# Patient Record
Sex: Male | Born: 1965 | Race: White | Hispanic: No | Marital: Married | Smoking: Former smoker
Health system: Southern US, Community
[De-identification: ages and names within clinical notes are randomized; demographics above are authoritative.]

## PROBLEM LIST (undated history)

## (undated) ENCOUNTER — Emergency Department (HOSPITAL_COMMUNITY): Payer: Self-pay

## (undated) DIAGNOSIS — N200 Calculus of kidney: Secondary | ICD-10-CM

---

## 2001-11-19 ENCOUNTER — Emergency Department (HOSPITAL_COMMUNITY): Admission: EM | Admit: 2001-11-19 | Discharge: 2001-11-19 | Payer: Self-pay | Admitting: Emergency Medicine

## 2007-09-19 ENCOUNTER — Emergency Department (HOSPITAL_COMMUNITY): Admission: EM | Admit: 2007-09-19 | Discharge: 2007-09-19 | Payer: Self-pay | Admitting: Emergency Medicine

## 2011-01-12 LAB — URINALYSIS, ROUTINE W REFLEX MICROSCOPIC
Bilirubin Urine: NEGATIVE
Glucose, UA: NEGATIVE
Specific Gravity, Urine: 1.024
pH: 5

## 2011-01-12 LAB — POCT I-STAT, CHEM 8
BUN: 14
Chloride: 106
Creatinine, Ser: 0.9
Glucose, Bld: 154 — ABNORMAL HIGH
Hemoglobin: 15
Potassium: 3.2 — ABNORMAL LOW

## 2011-01-12 LAB — DIFFERENTIAL
Eosinophils Absolute: 0.3
Lymphocytes Relative: 29
Lymphs Abs: 3
Monocytes Relative: 5
Neutrophils Relative %: 63

## 2011-01-12 LAB — CBC
MCV: 88.6
RBC: 4.83
WBC: 11.5 — ABNORMAL HIGH

## 2011-01-12 LAB — URINE MICROSCOPIC-ADD ON

## 2011-12-25 ENCOUNTER — Encounter (HOSPITAL_COMMUNITY): Payer: Self-pay | Admitting: *Deleted

## 2011-12-25 ENCOUNTER — Emergency Department (INDEPENDENT_AMBULATORY_CARE_PROVIDER_SITE_OTHER)
Admission: EM | Admit: 2011-12-25 | Discharge: 2011-12-25 | Disposition: A | Discharge status: Home / Self Care | Payer: Self-pay | Source: Home / Self Care | Attending: Family Medicine | Admitting: Family Medicine

## 2011-12-25 DIAGNOSIS — N201 Calculus of ureter: Secondary | ICD-10-CM

## 2011-12-25 HISTORY — DX: Calculus of kidney: N20.0

## 2011-12-25 LAB — POCT URINALYSIS DIP (DEVICE)
Glucose, UA: NEGATIVE mg/dL
Leukocytes, UA: NEGATIVE
Nitrite: NEGATIVE
Urobilinogen, UA: 0.2 mg/dL (ref 0.0–1.0)

## 2011-12-25 MED ORDER — TAMSULOSIN HCL 0.4 MG PO CAPS: 0.4000 mg | ORAL_CAPSULE | Freq: Every day | ORAL | Status: AC

## 2011-12-25 MED ORDER — ONDANSETRON HCL 4 MG/2ML IJ SOLN
INTRAMUSCULAR | Status: AC
  Filled 2011-12-25: qty 2

## 2011-12-25 MED ORDER — HYDROMORPHONE HCL PF 1 MG/ML IJ SOLN
INTRAMUSCULAR | Status: AC
  Filled 2011-12-25: qty 2

## 2011-12-25 MED ORDER — ONDANSETRON HCL 4 MG/2ML IJ SOLN: 4.0000 mg | Freq: Once | INTRAMUSCULAR | Status: DC

## 2011-12-25 MED ORDER — OXYCODONE-ACETAMINOPHEN 5-325 MG PO TABS: 1.0000 | ORAL_TABLET | ORAL | Status: AC | PRN

## 2011-12-25 MED ORDER — SODIUM CHLORIDE 0.9 % IV SOLN
Freq: Once | INTRAVENOUS | Status: AC
  Administered 2011-12-25: 15:00:00 via INTRAVENOUS

## 2011-12-25 MED ORDER — KETOROLAC TROMETHAMINE 30 MG/ML IJ SOLN: 30.0000 mg | Freq: Once | INTRAMUSCULAR | Status: DC

## 2011-12-25 MED ORDER — KETOROLAC TROMETHAMINE 10 MG PO TABS: 10.0000 mg | ORAL_TABLET | Freq: Four times a day (QID) | ORAL | Status: AC | PRN

## 2011-12-25 MED ORDER — HYDROMORPHONE HCL PF 1 MG/ML IJ SOLN
2.0000 mg | Freq: Once | INTRAMUSCULAR | Status: AC
  Administered 2011-12-25: 2 mg via INTRAVENOUS

## 2011-12-25 MED ORDER — KETOROLAC TROMETHAMINE 30 MG/ML IJ SOLN
30.0000 mg | Freq: Once | INTRAMUSCULAR | Status: AC
  Administered 2011-12-25: 30 mg via INTRAVENOUS

## 2011-12-25 MED ORDER — KETOROLAC TROMETHAMINE 30 MG/ML IJ SOLN
INTRAMUSCULAR | Status: AC
  Filled 2011-12-25: qty 1

## 2011-12-25 NOTE — ED Notes (Signed)
Pt  Reports  r        r  Flank  Pain       With  Nausea  /  Vomiting  Symptoms   Began  Several  Hours  Ago  With  Sensation of  Some  Burning as   Well          Has  Had  Kidney     Stone            In past

## 2011-12-25 NOTE — ED Notes (Signed)
Iv  Ns  500  Ml  Bolus      Infused  Via  20  Angio  And    Was  D/c        Pt  Was  Also  Given a  Strainer to take  home

## 2011-12-25 NOTE — ED Provider Notes (Signed)
History     CSN: [id number]  Arrival date & time 12/25/11  1403   First MD Initiated Contact with Patient 12/25/11 1416      Chief Complaint  Patient presents with  . Flank Pain    (Consider location/radiation/quality/duration/timing/severity/associated sxs/prior treatment) Patient is a 46 y.o. male presenting with flank pain. The history is provided by the patient and the spouse.  Flank Pain This is a new (h/o left kidney stone approx 1 yr ago.) problem. The current episode started 1 to 2 hours ago. The problem occurs constantly. The problem has been gradually worsening. Associated symptoms include abdominal pain. Associated symptoms comments: Right flank pain into right scrotum, diaphoresis, nausea..    History reviewed. No pertinent past medical history.  No past surgical history on file.  No family history on file.  History  Substance Use Topics  . Smoking status: Not on file  . Smokeless tobacco: Not on file  . Alcohol Use: Not on file      Review of Systems  Constitutional: Negative.   Respiratory: Negative.   Cardiovascular: Negative.   Gastrointestinal: Positive for nausea and abdominal pain.  Genitourinary: Positive for flank pain and testicular pain.  Neurological: Negative.     Allergies  Review of patient's allergies indicates no known allergies.  Home Medications  No current outpatient prescriptions on file.  BP 153/98  Pulse 100  Temp 98.3 F (36.8 C) (Oral)  Resp 24  SpO2 100%  Physical Exam  Nursing note and vitals reviewed. Constitutional: He is oriented to person, place, and time. He appears well-developed and well-nourished.  Abdominal: Soft. Bowel sounds are normal. He exhibits no distension and no mass. There is tenderness in the right lower quadrant and suprapubic area. There is CVA tenderness. There is no rebound and no guarding. Hernia confirmed negative in the right inguinal area and confirmed negative in the left inguinal area.    Neurological: He is alert and oriented to person, place, and time.  Skin: Skin is warm and dry.    ED Course  Procedures (including critical care time)  Labs Reviewed - No data to display No results found.   No diagnosis found.    MDM  Pain free at d/c. U/a hematuria      Linna Hoff, MD 12/29/11 1332

## 2011-12-25 NOTE — ED Notes (Signed)
Pt  Reports    Pain is  Much  Better  At this  Time  Nausea  Has  Subsided  As  well

## 2018-01-14 ENCOUNTER — Ambulatory Visit (INDEPENDENT_AMBULATORY_CARE_PROVIDER_SITE_OTHER): Payer: Self-pay

## 2018-01-14 ENCOUNTER — Ambulatory Visit (INDEPENDENT_AMBULATORY_CARE_PROVIDER_SITE_OTHER): Payer: Self-pay | Admitting: Family Medicine

## 2018-01-14 ENCOUNTER — Encounter (INDEPENDENT_AMBULATORY_CARE_PROVIDER_SITE_OTHER): Payer: Self-pay | Admitting: Family Medicine

## 2018-01-14 DIAGNOSIS — M25562 Pain in left knee: Secondary | ICD-10-CM

## 2018-01-14 MED ORDER — HYDROCODONE-ACETAMINOPHEN 5-325 MG PO TABS: 1.0000 | ORAL_TABLET | Freq: Four times a day (QID) | ORAL | 0 refills | Status: DC | PRN

## 2018-01-14 MED ORDER — ETODOLAC 400 MG PO TABS: 400.0000 mg | ORAL_TABLET | Freq: Two times a day (BID) | ORAL | 3 refills | Status: AC | PRN

## 2018-01-14 MED ORDER — METHYLPREDNISOLONE ACETATE 40 MG/ML IJ SUSP: 40.0000 mg | Freq: Once | INTRAMUSCULAR | Status: AC

## 2018-01-14 NOTE — Progress Notes (Signed)
   Office Visit Note   Patient: Dennis Wong           Date of Birth: 1965/12/11           MRN: [id number] Visit Date: 01/14/2018 Requested by: No referring provider defined for this encounter. PCP: Patient, No Pcp Per  Subjective: Chief Complaint  Patient presents with  . Left Knee - Pain    Left knee pain for about a couple weeks A little swelling in left knee Hurts to bend, walk, and apply pressure No fall/injury Naprosyn    HPI: He is a 52 year old with left knee pain.  Symptoms started 2 or 3 weeks ago, no injury.  Stiffness, swelling, pain with flexion.  Using naproxen with no improvement.  No history of gout.  No previous problems with his knee.  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              ROS: Otherwise noncontributory  Objective: Vital Signs: There were no vitals taken for this visit.  Physical Exam:  Left knee: 2+ effusion, no warmth or erythema.  No patellofemoral crepitus.  Full extension, flexion of 110 degrees.  Tender on the posterior medial joint line.  Tenderness in the popliteal fossa but no definite cyst.  Imaging: 2 view left knee x-rays: Well-preserved joint space, no significant arthritis, no sign of loose body.  Assessment & Plan: 1.  Left knee pain with effusion, suspicious for degenerative meniscus tear -Discussed options with patient, elected to aspirate and inject the knee today.  Follow-up as needed.  MRI if symptoms worsen.   Follow-Up Instructions: No follow-ups on file.       Procedures: Left knee aspiration and injection: After sterile prep with Betadine, injected 5 cc 1% lidocaine without epinephrine then aspirated 30 cc of clear yellow synovial fluid, then injected 40 mg methylprednisolone from superolateral approach.   PMFS History: There are no active problems to display for this patient.  Past Medical History:  Diagnosis Date  . Kidney stone     History reviewed. No pertinent family history.  History reviewed. No  pertinent surgical history. Social History   Occupational History  . Not on file  Tobacco Use  . Smoking status: Current Every Day Smoker  . Smokeless tobacco: Never Used  Substance and Sexual Activity  . Alcohol use: No  . Drug use: Not on file  . Sexual activity: Not on file

## 2018-01-29 ENCOUNTER — Other Ambulatory Visit (INDEPENDENT_AMBULATORY_CARE_PROVIDER_SITE_OTHER): Payer: Self-pay | Admitting: Family Medicine

## 2018-01-29 DIAGNOSIS — M25562 Pain in left knee: Secondary | ICD-10-CM

## 2018-01-29 MED ORDER — NABUMETONE 500 MG PO TABS: 500.0000 mg | ORAL_TABLET | Freq: Two times a day (BID) | ORAL | 3 refills | Status: AC | PRN

## 2018-01-29 NOTE — Progress Notes (Signed)
[facility] has the order, they will call pt to schedule.

## 2018-01-30 NOTE — Progress Notes (Signed)
Thank you :)

## 2018-02-10 ENCOUNTER — Ambulatory Visit
Admission: RE | Admit: 2018-02-10 | Discharge: 2018-02-10 | Disposition: A | Discharge status: Home / Self Care | Payer: Self-pay | Source: Ambulatory Visit | Attending: Family Medicine | Admitting: Family Medicine

## 2018-02-10 DIAGNOSIS — M25562 Pain in left knee: Secondary | ICD-10-CM

## 2018-02-11 ENCOUNTER — Telehealth (INDEPENDENT_AMBULATORY_CARE_PROVIDER_SITE_OTHER): Payer: Self-pay | Admitting: Family Medicine

## 2018-02-11 NOTE — Telephone Encounter (Signed)
Knee MRI shows a large tear of the medial meniscus cartilage.  This would explain why it keeps bothering him.  Presuming he's still in pain, I recommend coming in to see SD, MX, or CB for possible arthroscopic surgery.

## 2018-02-12 ENCOUNTER — Ambulatory Visit (INDEPENDENT_AMBULATORY_CARE_PROVIDER_SITE_OTHER): Payer: Self-pay | Admitting: Surgery

## 2018-02-12 ENCOUNTER — Telehealth (INDEPENDENT_AMBULATORY_CARE_PROVIDER_SITE_OTHER): Payer: Self-pay

## 2018-02-12 NOTE — Telephone Encounter (Signed)
The patient was advised of the results by another assistant today (see other message on this).  He has an appointment scheduled with Dr. August Saucer on 02/15/18.

## 2018-02-12 NOTE — Telephone Encounter (Signed)
Patient called concerning MRI results.  Advised patient of message per Dr. Prince Rome.  Patient stated that he will call back to schedule an appointment with one of the other providers.

## 2018-02-13 ENCOUNTER — Ambulatory Visit (INDEPENDENT_AMBULATORY_CARE_PROVIDER_SITE_OTHER): Payer: Self-pay | Admitting: Orthopedic Surgery

## 2018-02-15 ENCOUNTER — Telehealth (INDEPENDENT_AMBULATORY_CARE_PROVIDER_SITE_OTHER): Payer: Self-pay | Admitting: Orthopedic Surgery

## 2018-02-15 ENCOUNTER — Encounter (INDEPENDENT_AMBULATORY_CARE_PROVIDER_SITE_OTHER): Payer: Self-pay | Admitting: Orthopedic Surgery

## 2018-02-15 ENCOUNTER — Ambulatory Visit (INDEPENDENT_AMBULATORY_CARE_PROVIDER_SITE_OTHER): Payer: Self-pay | Admitting: Orthopedic Surgery

## 2018-02-15 DIAGNOSIS — M25562 Pain in left knee: Secondary | ICD-10-CM

## 2018-02-15 DIAGNOSIS — S838X2D Sprain of other specified parts of left knee, subsequent encounter: Secondary | ICD-10-CM

## 2018-02-15 MED ORDER — LIDOCAINE HCL 1 % IJ SOLN
5.0000 mL | INTRAMUSCULAR | Status: AC | PRN
  Administered 2018-02-15: 5 mL

## 2018-02-15 MED ORDER — HYDROCODONE-ACETAMINOPHEN 10-325 MG PO TABS: ORAL_TABLET | ORAL | 0 refills | Status: DC

## 2018-02-15 NOTE — Progress Notes (Signed)
Office Visit Note   Patient: Dennis Wong           Date of Birth: [birth year]           MRN: [id number] Visit Date: 02/15/2018 Requested by: No referring provider defined for this encounter. PCP: Patient, No Pcp Per  Subjective: Chief Complaint  Patient presents with  . Left Knee - Pain    HPI: Patient presents with left knee pain.  Since he was last seen by Dr. Prince Rome he had an MRI scan which is reviewed.  Patient has a large medial meniscal tear with substantial portion of the meniscus torn and flipped into the condylar notch region.  She also has stress reaction and possible stress fracture in that medial femoral condyle.  Patient localizes the pain discretely to the medial aspect of the knee.  Denies any history of injury.  He works in [employer] which does require a lot of standing.  Has had an aspiration and injection of cortisone which did not help.              ROS: All systems reviewed are negative as they relate to the chief complaint within the history of present illness.  Patient denies  fevers or chills.   Assessment & Plan: Visit Diagnoses:  1. Acute pain of left knee   2. Injury of meniscus of left knee, subsequent encounter     Plan: Impression is medial meniscal tear left knee with subsequent stress reaction and that medial femoral condyle.  Spontaneous osteonecrosis possible but less likely.  I like to check his vitamin D level today.  He states that essentially he never goes outside.  I think also that arthroscopy with meniscal debridement and intraosseous bio plasty is indicated at this time.  I also counseled him to diminish his weightbearing on that right leg which he will likely be reluctant to do.  I think surgical intervention is indicated with the.  Of nonweightbearing for at least 4 weeks afterwards in order to allow for healing.  No family or personal history of DVT or pulmonary embolism.  All questions answered.  Taken to consider their options and scheduled  for surgery potentially in the near future.  The aspiration of the bone marrow from the iliac crest is also described it would be an interval part of the procedure in order to facilitate healing of this stress reaction.  Follow-Up Instructions: No follow-ups on file.   Orders:  Orders Placed This Encounter  Procedures  . Vitamin D (25 hydroxy)   Meds ordered this encounter  Medications  . HYDROcodone-acetaminophen (NORCO) 10-325 MG tablet    Sig: 1 po q 8 hrs prn pain    Dispense:  30 tablet    Refill:  0      Procedures: Large Joint Inj: L knee on 02/15/2018 6:19 PM Indications: diagnostic evaluation, joint swelling and pain Details: 18 G 1.5 in needle, superolateral approach  Arthrogram: No  Medications: 5 mL lidocaine 1 % Aspirate: 40 mL yellow Outcome: tolerated well, no immediate complications Procedure, treatment alternatives, risks and benefits explained, specific risks discussed. Consent was given by the patient. Immediately prior to procedure a time out was called to verify the correct patient, procedure, equipment, support staff and site/side marked as required. Patient was prepped and draped in the usual sterile fashion.       Clinical Data: No additional findings.  Objective: Vital Signs: There were no vitals taken for this visit.  Physical Exam:  Constitutional: Patient appears well-developed HEENT:  Head: Normocephalic Eyes:EOM are normal Neck: Normal range of motion Cardiovascular: Normal rate Pulmonary/chest: Effort normal Neurologic: Patient is alert Skin: Skin is warm Psychiatric: Patient has normal mood and affect    Ortho Exam: Ortho exam demonstrates antalgic gait to the left with mild knee effusion to moderate knee effusion.  Medial sided femoral condyle tenderness is present.  Collateral cruciate ligaments are stable.  Range of motion is good.  Pedal pulses palpable.  No groin pain with internal extra rotation of the leg and only mild  varus deformity is present.  Specialty Comments:  No specialty comments available.  Imaging: No results found.   PMFS History: There are no active problems to display for this patient.  Past Medical History:  Diagnosis Date  . Kidney stone     History reviewed. No pertinent family history.  History reviewed. No pertinent surgical history. Social History   Occupational History  . Not on file  Tobacco Use  . Smoking status: Current Every Day Smoker  . Smokeless tobacco: Never Used  Substance and Sexual Activity  . Alcohol use: No  . Drug use: Not on file  . Sexual activity: Not on file

## 2018-02-15 NOTE — Telephone Encounter (Signed)
Patient's wife called stating that Dr. August Saucer prescribed for him was the exact same medication (hydrocodone) that did not work for him.  CB#[phone].  Thank you.

## 2018-02-15 NOTE — Telephone Encounter (Signed)
I tried calling her back. No answer. LMVM for her advising it is same medication but dose Dr August Saucer prescribed is stronger. I had also talked with them about this before they left office today.

## 2018-02-16 LAB — EXTRA LAV TOP TUBE

## 2018-02-16 LAB — VITAMIN D 25 HYDROXY (VIT D DEFICIENCY, FRACTURES): Vit D, 25-Hydroxy: 12 ng/mL — ABNORMAL LOW (ref 30–100)

## 2018-02-18 NOTE — Progress Notes (Signed)
Please call patient with results. Thanks patient has very low vitamin D.  His level is 12.  He needs oral supplementation 50,000 units a week for the next 8 weeks.  If he combines that with nonweightbearing I think his stress fracture will heal.  Please call in the prescription and call the patient thanks

## 2018-02-19 ENCOUNTER — Telehealth (INDEPENDENT_AMBULATORY_CARE_PROVIDER_SITE_OTHER): Payer: Self-pay

## 2018-02-19 ENCOUNTER — Other Ambulatory Visit (INDEPENDENT_AMBULATORY_CARE_PROVIDER_SITE_OTHER): Payer: Self-pay

## 2018-02-19 MED ORDER — VITAMIN D (ERGOCALCIFEROL) 1.25 MG (50000 UNIT) PO CAPS: 50000.0000 [IU] | ORAL_CAPSULE | ORAL | 0 refills | Status: AC

## 2018-02-19 NOTE — Telephone Encounter (Signed)
ca

## 2018-02-19 NOTE — Telephone Encounter (Signed)
-----   Message from Cammy Copa, MD sent at 02/18/2018  5:58 PM EST ----- Please call patient with results. Thanks patient has very low vitamin D.  His level is 12.  He needs oral supplementation 50,000 units a week for the next 8 weeks.  If he combines that with nonweightbearing I think his stress fracture will heal.  Please call in the prescription and call the patient thanks

## 2018-02-19 NOTE — Telephone Encounter (Signed)
Called pt to advise of lab results he voiced understanding. rx faxed into pharm and will call with any questions.

## 2018-04-17 ENCOUNTER — Other Ambulatory Visit (INDEPENDENT_AMBULATORY_CARE_PROVIDER_SITE_OTHER): Payer: Self-pay | Admitting: Orthopedic Surgery

## 2018-04-18 NOTE — Telephone Encounter (Signed)
He needs to get his vitamin D level rechecked by his primary care physician before we have him taking any more.

## 2018-04-18 NOTE — Telephone Encounter (Signed)
Refill

## 2019-07-26 ENCOUNTER — Ambulatory Visit: Payer: Self-pay | Attending: Internal Medicine

## 2019-07-26 DIAGNOSIS — Z23 Encounter for immunization: Secondary | ICD-10-CM

## 2019-07-26 NOTE — Progress Notes (Signed)
   Covid-19 Vaccination Clinic  Name:  Dennis Wong    MRN: [id number] DOB: 24-Jul-1965  07/26/2019  Mr. Mucha was observed post Covid-19 immunization for 15 minutes without incident. He was provided with Vaccine Information Sheet and instruction to access the V-Safe system.   Mr. Begley was instructed to call 911 with any severe reactions post vaccine: Marland Kitchen Difficulty breathing  . Swelling of face and throat  . A fast heartbeat  . A bad rash all over body  . Dizziness and weakness   Immunizations Administered    Name Date Dose VIS Date Route   Pfizer COVID-19 Vaccine 07/26/2019  8:23 AM 0.3 mL 03/28/2019 Intramuscular   Manufacturer: [employer], [employer]   Lot: [id number]   NDC: [address]-8

## 2019-08-19 ENCOUNTER — Ambulatory Visit: Payer: Self-pay | Attending: Internal Medicine

## 2019-08-19 DIAGNOSIS — Z23 Encounter for immunization: Secondary | ICD-10-CM

## 2019-08-19 NOTE — Progress Notes (Signed)
   Covid-19 Vaccination Clinic  Name:  Dennis Wong    MRN: [id number] DOB: 06-26-1965  08/19/2019  [name] was observed post Covid-19 immunization for 15 minutes without incident. He was provided with Vaccine Information Sheet and instruction to access the V-Safe system.   [name] was instructed to call 911 with any severe reactions post vaccine: Marland Kitchen Difficulty breathing  . Swelling of face and throat  . A fast heartbeat  . A bad rash all over body  . Dizziness and weakness   Immunizations Administered    Name Date Dose VIS Date Route   Pfizer COVID-19 Vaccine 08/19/2019  8:33 AM 0.3 mL 06/11/2018 Intramuscular   Manufacturer: [employer], [employer]   Lot: [id number]   NDC: [address]-0

## 2021-05-30 ENCOUNTER — Encounter (HOSPITAL_COMMUNITY): Payer: Self-pay | Admitting: *Deleted

## 2021-05-30 ENCOUNTER — Ambulatory Visit (HOSPITAL_COMMUNITY)
Admission: EM | Admit: 2021-05-30 | Discharge: 2021-05-30 | Disposition: A | Discharge status: Home / Self Care | Payer: Self-pay | Attending: Emergency Medicine | Admitting: Emergency Medicine

## 2021-05-30 ENCOUNTER — Emergency Department (HOSPITAL_COMMUNITY): Payer: Self-pay

## 2021-05-30 ENCOUNTER — Emergency Department (HOSPITAL_BASED_OUTPATIENT_CLINIC_OR_DEPARTMENT_OTHER): Payer: Self-pay | Admitting: Certified Registered"

## 2021-05-30 ENCOUNTER — Encounter (HOSPITAL_COMMUNITY)
Admission: EM | Disposition: A | Discharge status: Home / Self Care | Payer: Self-pay | Source: Home / Self Care | Attending: Emergency Medicine

## 2021-05-30 ENCOUNTER — Other Ambulatory Visit: Payer: Self-pay

## 2021-05-30 ENCOUNTER — Emergency Department (HOSPITAL_COMMUNITY): Payer: Self-pay | Admitting: Certified Registered"

## 2021-05-30 DIAGNOSIS — R17 Unspecified jaundice: Secondary | ICD-10-CM

## 2021-05-30 DIAGNOSIS — R7309 Other abnormal glucose: Secondary | ICD-10-CM

## 2021-05-30 DIAGNOSIS — K8020 Calculus of gallbladder without cholecystitis without obstruction: Secondary | ICD-10-CM

## 2021-05-30 DIAGNOSIS — R1011 Right upper quadrant pain: Secondary | ICD-10-CM

## 2021-05-30 DIAGNOSIS — F17210 Nicotine dependence, cigarettes, uncomplicated: Secondary | ICD-10-CM | POA: Insufficient documentation

## 2021-05-30 DIAGNOSIS — K8010 Calculus of gallbladder with chronic cholecystitis without obstruction: Secondary | ICD-10-CM | POA: Insufficient documentation

## 2021-05-30 DIAGNOSIS — K8050 Calculus of bile duct without cholangitis or cholecystitis without obstruction: Secondary | ICD-10-CM

## 2021-05-30 DIAGNOSIS — Z419 Encounter for procedure for purposes other than remedying health state, unspecified: Secondary | ICD-10-CM

## 2021-05-30 DIAGNOSIS — R739 Hyperglycemia, unspecified: Secondary | ICD-10-CM

## 2021-05-30 DIAGNOSIS — K8000 Calculus of gallbladder with acute cholecystitis without obstruction: Secondary | ICD-10-CM

## 2021-05-30 DIAGNOSIS — K810 Acute cholecystitis: Secondary | ICD-10-CM

## 2021-05-30 DIAGNOSIS — Z20822 Contact with and (suspected) exposure to covid-19: Secondary | ICD-10-CM | POA: Insufficient documentation

## 2021-05-30 HISTORY — PX: CHOLECYSTECTOMY: SHX55

## 2021-05-30 HISTORY — PX: INTRAOPERATIVE CHOLANGIOGRAM: SHX5230

## 2021-05-30 LAB — CBC WITH DIFFERENTIAL/PLATELET
Abs Immature Granulocytes: 0.03 10*3/uL (ref 0.00–0.07)
Basophils Absolute: 0.1 10*3/uL (ref 0.0–0.1)
Basophils Relative: 1 %
Eosinophils Absolute: 0.1 10*3/uL (ref 0.0–0.5)
Eosinophils Relative: 1 %
HCT: 43.8 % (ref 39.0–52.0)
Hemoglobin: 15.1 g/dL (ref 13.0–17.0)
Immature Granulocytes: 0 %
Lymphocytes Relative: 18 %
Lymphs Abs: 1.2 10*3/uL (ref 0.7–4.0)
MCH: 28.8 pg (ref 26.0–34.0)
MCHC: 34.5 g/dL (ref 30.0–36.0)
MCV: 83.4 fL (ref 80.0–100.0)
Monocytes Absolute: 0.4 10*3/uL (ref 0.1–1.0)
Monocytes Relative: 6 %
Neutro Abs: 5.1 10*3/uL (ref 1.7–7.7)
Neutrophils Relative %: 74 %
Platelets: 195 10*3/uL (ref 150–400)
RBC: 5.25 MIL/uL (ref 4.22–5.81)
RDW: 14.1 % (ref 11.5–15.5)
WBC: 6.9 10*3/uL (ref 4.0–10.5)
nRBC: 0 % (ref 0.0–0.2)

## 2021-05-30 LAB — COMPREHENSIVE METABOLIC PANEL
ALT: 14 U/L (ref 0–44)
AST: 23 U/L (ref 15–41)
Albumin: 4.2 g/dL (ref 3.5–5.0)
Alkaline Phosphatase: 85 U/L (ref 38–126)
Anion gap: 9 (ref 5–15)
BUN: 12 mg/dL (ref 6–20)
CO2: 23 mmol/L (ref 22–32)
Calcium: 9.4 mg/dL (ref 8.9–10.3)
Chloride: 104 mmol/L (ref 98–111)
Creatinine, Ser: 0.81 mg/dL (ref 0.61–1.24)
GFR, Estimated: >60 mL/min (ref >60)
Glucose, Bld: 167 mg/dL — ABNORMAL HIGH (ref 70–99)
Potassium: 4.3 mmol/L (ref 3.5–5.1)
Sodium: 136 mmol/L (ref 135–145)
Total Bilirubin: 1.3 mg/dL — ABNORMAL HIGH (ref 0.3–1.2)
Total Protein: 6.5 g/dL (ref 6.5–8.1)

## 2021-05-30 LAB — RESP PANEL BY RT-PCR (FLU A&B, COVID) ARPGX2
Influenza A by PCR: NEGATIVE
Influenza B by PCR: NEGATIVE
SARS Coronavirus 2 by RT PCR: NEGATIVE

## 2021-05-30 LAB — LIPASE, BLOOD: Lipase: 34 U/L (ref 11–51)

## 2021-05-30 SURGERY — LAPAROSCOPIC CHOLECYSTECTOMY WITH INTRAOPERATIVE CHOLANGIOGRAM
Anesthesia: General | Site: Abdomen

## 2021-05-30 MED ORDER — MIDAZOLAM HCL 2 MG/2ML IJ SOLN
INTRAMUSCULAR | Status: AC
  Administered 2021-05-30: 2 mg via INTRAVENOUS
  Filled 2021-05-30: qty 2

## 2021-05-30 MED ORDER — LIDOCAINE 2% (20 MG/ML) 5 ML SYRINGE
INTRAMUSCULAR | Status: AC
  Filled 2021-05-30: qty 5

## 2021-05-30 MED ORDER — ACETAMINOPHEN 160 MG/5ML PO SOLN: 325.0000 mg | ORAL | Status: DC | PRN

## 2021-05-30 MED ORDER — ROCURONIUM BROMIDE 10 MG/ML (PF) SYRINGE
PREFILLED_SYRINGE | INTRAVENOUS | Status: DC | PRN
  Administered 2021-05-30: 80 mg via INTRAVENOUS
  Administered 2021-05-30: 20 mg via INTRAVENOUS

## 2021-05-30 MED ORDER — FENTANYL CITRATE (PF) 250 MCG/5ML IJ SOLN
INTRAMUSCULAR | Status: AC
  Filled 2021-05-30: qty 5

## 2021-05-30 MED ORDER — ONDANSETRON HCL 4 MG PO TABS: 4.0000 mg | ORAL_TABLET | Freq: Four times a day (QID) | ORAL | 0 refills | Status: DC | PRN

## 2021-05-30 MED ORDER — SUCCINYLCHOLINE CHLORIDE 200 MG/10ML IV SOSY
PREFILLED_SYRINGE | INTRAVENOUS | Status: AC
  Filled 2021-05-30: qty 10

## 2021-05-30 MED ORDER — PROPOFOL 10 MG/ML IV BOLUS
INTRAVENOUS | Status: AC
  Filled 2021-05-30: qty 20

## 2021-05-30 MED ORDER — MEPERIDINE HCL 25 MG/ML IJ SOLN: 6.2500 mg | INTRAMUSCULAR | Status: DC | PRN

## 2021-05-30 MED ORDER — CHLORHEXIDINE GLUCONATE 0.12 % MT SOLN
15.0000 mL | Freq: Once | OROMUCOSAL | Status: AC
  Administered 2021-05-30: 15 mL via OROMUCOSAL

## 2021-05-30 MED ORDER — SODIUM CHLORIDE 0.9 % IV SOLN
2.0000 g | Freq: Once | INTRAVENOUS | Status: AC
  Administered 2021-05-30: 2 g via INTRAVENOUS
  Filled 2021-05-30: qty 20

## 2021-05-30 MED ORDER — MIDAZOLAM HCL 2 MG/2ML IJ SOLN: 2.0000 mg | Freq: Once | INTRAMUSCULAR | Status: AC

## 2021-05-30 MED ORDER — LIDOCAINE 2% (20 MG/ML) 5 ML SYRINGE
INTRAMUSCULAR | Status: DC | PRN
  Administered 2021-05-30: 80 mg via INTRAVENOUS

## 2021-05-30 MED ORDER — ACETAMINOPHEN 10 MG/ML IV SOLN
INTRAVENOUS | Status: DC | PRN
  Administered 2021-05-30: 1000 mg via INTRAVENOUS

## 2021-05-30 MED ORDER — SODIUM CHLORIDE 0.9 % IV SOLN
INTRAVENOUS | Status: DC | PRN
  Administered 2021-05-30: 10 mL

## 2021-05-30 MED ORDER — DEXAMETHASONE SODIUM PHOSPHATE 10 MG/ML IJ SOLN
INTRAMUSCULAR | Status: DC | PRN
  Administered 2021-05-30: 10 mg via INTRAVENOUS

## 2021-05-30 MED ORDER — MORPHINE SULFATE (PF) 4 MG/ML IV SOLN
4.0000 mg | Freq: Once | INTRAVENOUS | Status: AC
  Administered 2021-05-30: 4 mg via INTRAVENOUS
  Filled 2021-05-30: qty 1

## 2021-05-30 MED ORDER — OXYCODONE HCL 5 MG PO TABS: 5.0000 mg | ORAL_TABLET | Freq: Once | ORAL | Status: DC | PRN

## 2021-05-30 MED ORDER — PHENYLEPHRINE HCL-NACL 20-0.9 MG/250ML-% IV SOLN
INTRAVENOUS | Status: DC | PRN
  Administered 2021-05-30: 50 ug/min via INTRAVENOUS

## 2021-05-30 MED ORDER — KETOROLAC TROMETHAMINE 30 MG/ML IJ SOLN
INTRAMUSCULAR | Status: DC | PRN
  Administered 2021-05-30: 30 mg via INTRAVENOUS

## 2021-05-30 MED ORDER — BUPIVACAINE-EPINEPHRINE 0.25% -1:200000 IJ SOLN
INTRAMUSCULAR | Status: DC | PRN
  Administered 2021-05-30: 14 mL

## 2021-05-30 MED ORDER — ONDANSETRON HCL 4 MG/2ML IJ SOLN
4.0000 mg | Freq: Once | INTRAMUSCULAR | Status: AC
  Administered 2021-05-30: 4 mg via INTRAVENOUS
  Filled 2021-05-30: qty 2

## 2021-05-30 MED ORDER — ONDANSETRON HCL 4 MG/2ML IJ SOLN: 4.0000 mg | Freq: Once | INTRAMUSCULAR | Status: DC | PRN

## 2021-05-30 MED ORDER — ACETAMINOPHEN 500 MG PO TABS
1000.0000 mg | ORAL_TABLET | Freq: Three times a day (TID) | ORAL | 0 refills | Status: AC | PRN
Start: 2021-05-30 — End: ?

## 2021-05-30 MED ORDER — FENTANYL CITRATE (PF) 250 MCG/5ML IJ SOLN
INTRAMUSCULAR | Status: DC | PRN
  Administered 2021-05-30 (×3): 50 ug via INTRAVENOUS
  Administered 2021-05-30: 100 ug via INTRAVENOUS

## 2021-05-30 MED ORDER — SUGAMMADEX SODIUM 200 MG/2ML IV SOLN
INTRAVENOUS | Status: DC | PRN
Start: 2021-05-30 — End: 2021-05-30
  Administered 2021-05-30: 400 mg via INTRAVENOUS

## 2021-05-30 MED ORDER — MIDAZOLAM HCL 2 MG/2ML IJ SOLN
INTRAMUSCULAR | Status: AC
  Filled 2021-05-30: qty 2

## 2021-05-30 MED ORDER — SODIUM CHLORIDE 0.9 % IV BOLUS (SEPSIS)
1000.0000 mL | Freq: Once | INTRAVENOUS | Status: AC
  Administered 2021-05-30: 1000 mL via INTRAVENOUS

## 2021-05-30 MED ORDER — ACETAMINOPHEN 325 MG PO TABS: 325.0000 mg | ORAL_TABLET | ORAL | Status: DC | PRN

## 2021-05-30 MED ORDER — OXYCODONE HCL 5 MG PO TABS: 5.0000 mg | ORAL_TABLET | Freq: Four times a day (QID) | ORAL | 0 refills | Status: DC | PRN

## 2021-05-30 MED ORDER — HYDROCODONE-ACETAMINOPHEN 5-325 MG PO TABS: 1.0000 | ORAL_TABLET | Freq: Four times a day (QID) | ORAL | 0 refills | Status: DC | PRN

## 2021-05-30 MED ORDER — 0.9 % SODIUM CHLORIDE (POUR BTL) OPTIME
TOPICAL | Status: DC | PRN
  Administered 2021-05-30: 1000 mL

## 2021-05-30 MED ORDER — OXYCODONE HCL 5 MG/5ML PO SOLN: 5.0000 mg | Freq: Once | ORAL | Status: DC | PRN

## 2021-05-30 MED ORDER — INDOCYANINE GREEN 25 MG IV SOLR
INTRAVENOUS | Status: DC | PRN
  Administered 2021-05-30: 7.5 mg via INTRAVENOUS

## 2021-05-30 MED ORDER — FENTANYL CITRATE (PF) 100 MCG/2ML IJ SOLN: 25.0000 ug | INTRAMUSCULAR | Status: DC | PRN

## 2021-05-30 MED ORDER — PHENYLEPHRINE 40 MCG/ML (10ML) SYRINGE FOR IV PUSH (FOR BLOOD PRESSURE SUPPORT)
PREFILLED_SYRINGE | INTRAVENOUS | Status: DC | PRN
  Administered 2021-05-30 (×2): 120 ug via INTRAVENOUS
  Administered 2021-05-30: 160 ug via INTRAVENOUS

## 2021-05-30 MED ORDER — ORAL CARE MOUTH RINSE: 15.0000 mL | Freq: Once | OROMUCOSAL | Status: AC

## 2021-05-30 MED ORDER — ROCURONIUM BROMIDE 10 MG/ML (PF) SYRINGE
PREFILLED_SYRINGE | INTRAVENOUS | Status: AC
  Filled 2021-05-30: qty 10

## 2021-05-30 MED ORDER — PROPOFOL 10 MG/ML IV BOLUS
INTRAVENOUS | Status: DC | PRN
  Administered 2021-05-30: 200 mg via INTRAVENOUS

## 2021-05-30 MED ORDER — SUCCINYLCHOLINE CHLORIDE 200 MG/10ML IV SOSY
PREFILLED_SYRINGE | INTRAVENOUS | Status: DC | PRN
  Administered 2021-05-30: 120 mg via INTRAVENOUS

## 2021-05-30 MED ORDER — SODIUM CHLORIDE 0.9 % IV SOLN
1000.0000 mL | INTRAVENOUS | Status: DC
  Administered 2021-05-30: 1000 mL via INTRAVENOUS

## 2021-05-30 MED ORDER — SODIUM CHLORIDE 0.9 % IR SOLN
Status: DC | PRN
Start: 2021-05-30 — End: 2021-05-30
  Administered 2021-05-30: 1000 mL

## 2021-05-30 MED ORDER — PHENYLEPHRINE 40 MCG/ML (10ML) SYRINGE FOR IV PUSH (FOR BLOOD PRESSURE SUPPORT)
PREFILLED_SYRINGE | INTRAVENOUS | Status: AC
  Filled 2021-05-30: qty 10

## 2021-05-30 MED ORDER — BUPIVACAINE-EPINEPHRINE (PF) 0.25% -1:200000 IJ SOLN
INTRAMUSCULAR | Status: AC
  Filled 2021-05-30: qty 30

## 2021-05-30 MED ORDER — ONDANSETRON HCL 4 MG/2ML IJ SOLN
INTRAMUSCULAR | Status: DC | PRN
  Administered 2021-05-30: 4 mg via INTRAVENOUS

## 2021-05-30 MED ORDER — LACTATED RINGERS IV SOLN: INTRAVENOUS | Status: DC

## 2021-05-30 SURGICAL SUPPLY — 42 items
APPLIER CLIP 5 13 M/L LIGAMAX5 (MISCELLANEOUS) ×2
BENZOIN TINCTURE PRP APPL 2/3 (GAUZE/BANDAGES/DRESSINGS) ×1 IMPLANT
CANISTER SUCT 3000ML PPV (MISCELLANEOUS) ×2 IMPLANT
CHLORAPREP W/TINT 26 (MISCELLANEOUS) ×2 IMPLANT
CLIP APPLIE 5 13 M/L LIGAMAX5 (MISCELLANEOUS) ×1 IMPLANT
COVER MAYO STAND STRL (DRAPES) ×2 IMPLANT
COVER SURGICAL LIGHT HANDLE (MISCELLANEOUS) ×2 IMPLANT
DRAPE C-ARM 42X120 X-RAY (DRAPES) ×2 IMPLANT
DRSG TEGADERM 2-3/8X2-3/4 SM (GAUZE/BANDAGES/DRESSINGS) ×6 IMPLANT
DRSG TEGADERM 4X10 (GAUZE/BANDAGES/DRESSINGS) ×1 IMPLANT
ELECT REM PT RETURN 9FT ADLT (ELECTROSURGICAL) ×2
ELECTRODE REM PT RTRN 9FT ADLT (ELECTROSURGICAL) ×1 IMPLANT
GAUZE SPONGE 2X2 8PLY STRL LF (GAUZE/BANDAGES/DRESSINGS) ×1 IMPLANT
GLOVE SURG ENC TEXT LTX SZ7.5 (GLOVE) ×2 IMPLANT
GLOVE SURG UNDER LTX SZ8 (GLOVE) ×4 IMPLANT
GOWN STRL REUS W/ TWL LRG LVL3 (GOWN DISPOSABLE) ×3 IMPLANT
GOWN STRL REUS W/TWL 2XL LVL3 (GOWN DISPOSABLE) ×2 IMPLANT
GOWN STRL REUS W/TWL LRG LVL3 (GOWN DISPOSABLE) ×6
GRASPER SUT TROCAR 14GX15 (MISCELLANEOUS) ×2 IMPLANT
KIT BASIN OR (CUSTOM PROCEDURE TRAY) ×2 IMPLANT
KIT TURNOVER KIT B (KITS) ×2 IMPLANT
NS IRRIG 1000ML POUR BTL (IV SOLUTION) ×2 IMPLANT
PAD ARMBOARD 7.5X6 YLW CONV (MISCELLANEOUS) ×2 IMPLANT
POUCH RETRIEVAL ECOSAC 10 (ENDOMECHANICALS) ×1 IMPLANT
POUCH RETRIEVAL ECOSAC 10MM (ENDOMECHANICALS) ×2
SCISSORS LAP 5X35 DISP (ENDOMECHANICALS) ×2 IMPLANT
SET CHOLANGIOGRAPH 5 50 .035 (SET/KITS/TRAYS/PACK) ×2 IMPLANT
SET IRRIG TUBING LAPAROSCOPIC (IRRIGATION / IRRIGATOR) ×2 IMPLANT
SET TUBE SMOKE EVAC HIGH FLOW (TUBING) ×2 IMPLANT
SLEEVE ENDOPATH XCEL 5M (ENDOMECHANICALS) ×4 IMPLANT
SPECIMEN JAR SMALL (MISCELLANEOUS) ×2 IMPLANT
SPONGE GAUZE 2X2 STER 10/PKG (GAUZE/BANDAGES/DRESSINGS) ×1
STRIP CLOSURE SKIN 1/2X4 (GAUZE/BANDAGES/DRESSINGS) ×2 IMPLANT
SUT MNCRL AB 4-0 PS2 18 (SUTURE) ×2 IMPLANT
SUT VIC AB 0 UR5 27 (SUTURE) ×2 IMPLANT
SUT VICRYL 0 UR6 27IN ABS (SUTURE) IMPLANT
TOWEL GREEN STERILE (TOWEL DISPOSABLE) ×2 IMPLANT
TOWEL GREEN STERILE FF (TOWEL DISPOSABLE) ×2 IMPLANT
TRAY LAPAROSCOPIC MC (CUSTOM PROCEDURE TRAY) ×2 IMPLANT
TROCAR XCEL BLUNT TIP 100MML (ENDOMECHANICALS) ×2 IMPLANT
TROCAR XCEL NON-BLD 5MMX100MML (ENDOMECHANICALS) ×2 IMPLANT
WATER STERILE IRR 1000ML POUR (IV SOLUTION) ×2 IMPLANT

## 2021-05-30 NOTE — Op Note (Signed)
Dennis Wong LF:[id number] 02-26-1966 05/30/2021  Laparoscopic Cholecystectomy with ICG dye immunofluorescence & IOC Procedure Note  Indications: This patient presents with symptomatic gallbladder disease and will undergo laparoscopic cholecystectomy.  Pre-operative Diagnosis: Symptomatic cholelithiasis  Post-operative Diagnosis: acute calculous cholecystitis  Surgeon: Greer Pickerel MD FACS  Assistants: none  Anesthesia: General endotracheal anesthesia   Procedure Details  The patient was seen again in the Holding Room. The risks, benefits, complications, treatment options, and expected outcomes were discussed with the patient. The possibilities of reaction to medication, pulmonary aspiration, perforation of viscus, bleeding, recurrent infection, finding a normal gallbladder, the need for additional procedures, failure to diagnose a condition, the possible need to convert to an open procedure, and creating a complication requiring transfusion or operation were discussed with the patient. The likelihood of improving the patient's symptoms with return to their baseline status is good.  The patient and/or family concurred with the proposed plan, giving informed consent. The site of surgery properly noted. The patient was taken to Operating Room, identified as Dennis Wong and the procedure verified as Laparoscopic Cholecystectomy with Intraoperative Cholangiogram. A Time Out was held and the above information confirmed. Antibiotic prophylaxis was administered.   Prior to the induction of general anesthesia, antibiotic prophylaxis was administered. General endotracheal anesthesia was then administered and tolerated well. After the induction, the abdomen was prepped with Chloraprep and draped in the sterile fashion. The patient was positioned in the supine position.  Local anesthetic agent was injected into the skin near the umbilicus and an incision made. We dissected down to the abdominal fascia with  blunt dissection.  The fascia was incised vertically and we entered the peritoneal cavity bluntly.  A pursestring suture of 0-Vicryl was placed around the fascial opening.  The Hasson cannula was inserted and secured with the stay suture.  Pneumoperitoneum was then created with CO2 and tolerated well without any adverse changes in the patient's vital signs.  Patient had adhesions to the anterior abdominal and right mid abdomen.  These were omental adhesions to the anterior abdominal wall.  An 5-mm port was placed in the subxiphoid position.  These adhesions were taken down with EndoShears without electrocautery.  Two 5-mm ports were placed in the right upper quadrant. All skin incisions were infiltrated with a local anesthetic agent before making the incision and placing the trocars.   We positioned the patient in reverse Trendelenburg, tilted slightly to the patient's left.  The gallbladder was identified, the fundus grasped and retracted cephalad.  The gallbladder was contracted and thick-walled.  In order to facilitate retraction the gallbladder was aspirated.  Adhesions were lysed bluntly and with the electrocautery where indicated, taking care not to injure any adjacent organs or viscus. The infundibulum was grasped and retracted laterally, exposing the peritoneum overlying the triangle of Calot. This was then divided and exposed in a blunt fashion. A critical view of the cystic duct and cystic artery was obtained.  ICG immunofluorescent lighting was activated and we could visualize the dye within the cystic duct.  There were no other structures entering the gallbladder other than the cystic artery and the cystic duct.  I cannot really see the common bile duct with immunofluorescence.  But the cystic duct was clearly identified.  Just concerned as a second confirmation.  The cystic duct was clearly identified and bluntly dissected circumferentially. The cystic duct was ligated with a clip distally.   An  incision was made in the cystic duct and the [facility] cholangiogram catheter introduced. The  catheter was secured using a clip. A cholangiogram was then obtained which showed good visualization of the distal and proximal biliary tree with no sign of filling defects or obstruction.  Contrast flowed easily into the duodenum. The catheter was then removed.   The cystic duct was then ligated with clips and divided. The cystic artery which had been identified & dissected free was ligated with clips and divided as well.   The gallbladder was dissected from the liver bed in retrograde fashion with the electrocautery.  I did come across the posterior branch of the cystic artery while mobilizing the gallbladder.  This was clipped and then divided.  The gallbladder was removed and placed in an Ecco sac.  The gallbladder and Ecco sac were then removed through the supra umbilical port site. The liver bed was irrigated and inspected. Hemostasis was achieved with the electrocautery. Copious irrigation was utilized and was repeatedly aspirated until clear.  The pursestring suture was used to close the umbilical fascia.  2 additional interrupted 0 Vicryl sutures were placed at the supraumbilical fascia with a PMI with laparoscopic guidance.  Additional local was infiltrated.  We again inspected the right upper quadrant for hemostasis.  The umbilical closure was inspected and there was no air leak and nothing trapped within the closure. Pneumoperitoneum was released as we removed the trocars.  4-0 Monocryl was used to close the skin.    steri-strips, and clean dressings were applied. The patient was then extubated and brought to the recovery room in stable condition. Instrument, sponge, and needle counts were correct at closure and at the conclusion of the case.   Findings: Cholecystitis with Cholelithiasis Normal IOC  Estimated Blood Loss: Minimal         Drains: None         Specimens: Gallbladder            Complications: None; patient tolerated the procedure well.         Disposition: PACU - hemodynamically stable.         Condition: stable  Leighton Ruff. Redmond Pulling, MD, FACS General, Bariatric, & Minimally Invasive Surgery [facility], [place]

## 2021-05-30 NOTE — Anesthesia Procedure Notes (Signed)
Procedure Name: Intubation Date/Time: 05/30/2021 12:24 PM Performed by: Rosiland Oz, CRNA Pre-anesthesia Checklist: Patient identified, Emergency Drugs available, Suction available, Patient being monitored and Timeout performed Patient Re-evaluated:Patient Re-evaluated prior to induction Oxygen Delivery Method: Circle system utilized Preoxygenation: Pre-oxygenation with 100% oxygen Induction Type: IV induction, Rapid sequence and Cricoid Pressure applied Ventilation: Mask ventilation without difficulty Laryngoscope Size: Miller and 3 Grade View: Grade I Tube type: Oral Tube size: 7.5 mm Number of attempts: 1 Airway Equipment and Method: Stylet Placement Confirmation: ETT inserted through vocal cords under direct vision, positive ETCO2 and breath sounds checked- equal and bilateral Secured at: 22 cm Tube secured with: Tape Dental Injury: Teeth and Oropharynx as per pre-operative assessment

## 2021-05-30 NOTE — Anesthesia Preprocedure Evaluation (Addendum)
Anesthesia Evaluation  Patient identified by MRN, date of birth, ID band Patient awake    Reviewed: Allergy & Precautions, H&P , NPO status , Patient's Chart, lab work & pertinent test results, reviewed documented beta blocker date and time   Airway Mallampati: I  TM Distance: >3 FB Neck ROM: full    Dental no notable dental hx. (+) Teeth Intact, Missing, Partial Lower,    Pulmonary Current Smoker, former smoker,    Pulmonary exam normal breath sounds clear to auscultation       Cardiovascular Exercise Tolerance: Good negative cardio ROS   Rhythm:regular Rate:Normal     Neuro/Psych negative neurological ROS  negative psych ROS   GI/Hepatic negative GI ROS, Neg liver ROS,   Endo/Other  negative endocrine ROS  Renal/GU Renal disease  negative genitourinary   Musculoskeletal   Abdominal   Peds  Hematology negative hematology ROS (+)   Anesthesia Other Findings   Reproductive/Obstetrics negative OB ROS                            Anesthesia Physical Anesthesia Plan  ASA: 2 and emergent  Anesthesia Plan: General   Post-op Pain Management: Ofirmev IV (intra-op)   Induction: Intravenous  PONV Risk Score and Plan: 2 and Ondansetron, Dexamethasone and Treatment may vary due to age or medical condition  Airway Management Planned: Oral ETT  Additional Equipment: None  Intra-op Plan:   Post-operative Plan: Extubation in OR  Informed Consent: I have reviewed the patients History and Physical, chart, labs and discussed the procedure including the risks, benefits and alternatives for the proposed anesthesia with the patient or authorized representative who has indicated his/her understanding and acceptance.     Dental Advisory Given  Plan Discussed with: CRNA and Anesthesiologist  Anesthesia Plan Comments: (  )       Anesthesia Quick Evaluation

## 2021-05-30 NOTE — ED Triage Notes (Signed)
C/o severe abd. Pain onset 130am today denies n/v states he tried to vomit but couldn't denies diarrhea last BM 5 hours ago.

## 2021-05-30 NOTE — ED Provider Notes (Signed)
[facility] Provider Note   CSN: [id number] Arrival date & time: 05/30/21  0531     History  Chief Complaint  Patient presents with   Abdominal Pain    Dennis Wong is a 56 y.o. male.  The history is provided by the patient.  Abdominal Pain He has no significant medical history and comes in with onset at 1:30 AM of severe epigastric pain with radiation to the back.  There is has been nausea but no vomiting.  He has had some chills but no fever or sweats.  He had eaten pizza and chicken wings before going to bed.  He has never had pain like this before.   Home Medications Prior to Admission medications   Medication Sig Start Date End Date Taking? Authorizing Provider  etodolac (LODINE) 400 MG tablet Take 1 tablet (400 mg total) by mouth 2 (two) times daily as needed. 01/14/18   Hilts, Legrand Como, MD  HYDROcodone-acetaminophen [facility]) 10-325 MG tablet 1 po q 8 hrs prn pain 02/15/18   Meredith Pel, MD  HYDROcodone-acetaminophen (NORCO/VICODIN) 5-325 MG tablet Take 1 tablet by mouth every 6 (six) hours as needed for moderate pain. 01/14/18   Hilts, Legrand Como, MD  nabumetone (RELAFEN) 500 MG tablet Take 1 tablet (500 mg total) by mouth 2 (two) times daily as needed. 01/29/18   Hilts, Legrand Como, MD  Tamsulosin HCl (FLOMAX) 0.4 MG CAPS Take 1 capsule (0.4 mg total) by mouth daily. 12/25/11   Billy Fischer, MD  Vitamin D, Ergocalciferol, (DRISDOL) 50000 units CAPS capsule Take 1 capsule (50,000 Units total) by mouth every 7 (seven) days. 1 capsule once a week for 8 weeks. 02/19/18   Meredith Pel, MD      Allergies    Patient has no known allergies.    Review of Systems   Review of Systems  Gastrointestinal:  Positive for abdominal pain.  All other systems reviewed and are negative.  Physical Exam Updated Vital Signs BP (!) 182/114    Pulse 62    Temp 97.6 F (36.4 C) (Oral)    Resp 18    Ht 6' (1.829 m)    Wt 108.9 kg    SpO2 96%    BMI 32.55  kg/m  Physical Exam Vitals and nursing note reviewed.  56 year old male, appears uncomfortable, but is in no acute distress. Vital signs are significant for elevated blood pressure. Oxygen saturation is 96%, which is normal. Head is normocephalic and atraumatic. PERRLA, EOMI. Oropharynx is clear. Neck is nontender and supple without adenopathy or JVD. Back is nontender and there is no CVA tenderness. Lungs are clear without rales, wheezes, or rhonchi. Chest is nontender. Heart has regular rate and rhythm without murmur. Abdomen is soft, flat, with moderate right upper quadrant tenderness.  There is no rebound or guarding.  Murphy sign is negative.  There are no masses or hepatosplenomegaly and peristalsis is hypoactive. Extremities have no cyanosis or edema, full range of motion is present. Skin is warm and dry without rash. Neurologic: Mental status is normal, cranial nerves are intact, moves all extremities equally.  ED Results / Procedures / Treatments   Labs (all labs ordered are listed, but only abnormal results are displayed) Labs Reviewed  COMPREHENSIVE METABOLIC PANEL - Abnormal; Notable for the following components:      Result Value   Glucose, Bld 167 (*)    Total Bilirubin 1.3 (*)    All other components within normal limits  LIPASE, BLOOD  CBC WITH DIFFERENTIAL/PLATELET   Radiology US Abdomen Limited RUQ (LIVER/GB)  Result Date: 05/30/2021 CLINICAL DATA:  Right upper quadrant pain EXAM: ULTRASOUND ABDOMEN LIMITED RIGHT UPPER QUADRANT COMPARISON:  None available FINDINGS: Gallbladder: Shadowing gallstones. Coment tail reflectors scattered in the gallbladder wall which measures 2-3 mm. Gallbladder region tenderness but no pericholecystic edema or over distention. Common bile duct: Diameter: 6-7 mm.  Where visualized, no filling defect. Liver: No focal lesion identified. Within normal limits in parenchymal echogenicity. Portal vein is patent on color Doppler imaging with  normal direction of blood flow towards the liver. IMPRESSION: 1. Cholelithiasis. Gallbladder tenderness but no wall thickening or over distension typical of acute cholecystitis. 2. Adenomyomatosis. Electronically Signed   By: Jorje Guild M.D.   On: 05/30/2021 07:07    Procedures Procedures    Medications Ordered in ED Medications  ondansetron (ZOFRAN) injection 4 mg (has no administration in time range)  morphine (PF) 4 MG/ML injection 4 mg (has no administration in time range)    ED Course/ Medical Decision Making/ A&P                           Medical Decision Making Amount and/or Complexity of Data Reviewed Labs: ordered. Radiology: ordered.  Risk Prescription drug management.   Epigastric pain with right upper quadrant tenderness concerning for cholecystitis.  Consider gastritis, pancreatitis, diverticulitis.  Will send for right upper quadrant ultrasound to evaluate for possible cholelithiasis and cholecystitis.  Screening labs were obtained and he will be given IV morphine for pain and ondansetron for nausea.  Old records are reviewed, and he has no relevant past visits.  Ultrasound does show cholelithiasis without cholecystitis.  Labs show normal WBC, normal transaminases and minimally elevated bilirubin.  He had partial relief of pain with morphine and is being given additional morphine.  Case is signed out to Dr. Tomi Bamberger to make sure he gets adequate pain control.  He will be referred to general surgery for follow-up to arrange elective cholecystectomy.        Final Clinical Impression(s) / ED Diagnoses Final diagnoses:  RUQ pain  Calculus of gallbladder without cholecystitis without obstruction  Biliary colic  Serum total bilirubin elevated  Elevated random blood glucose level    Rx / DC Orders ED Discharge Orders     None         Delora Fuel, [place] [id number] [phone]

## 2021-05-30 NOTE — ED Provider Notes (Signed)
Discussed with Gen surg , will evaluate patient regarding persistent pain, concern for developing acute cholecystitis.  Additional IV pain meds and fluids ordered.   Linwood Dibbles, MD 05/30/21 1020

## 2021-05-30 NOTE — ED Notes (Signed)
Patient transported to Ultrasound 

## 2021-05-30 NOTE — Progress Notes (Signed)
Dr. Ambrose Pancoast notified

## 2021-05-30 NOTE — ED Notes (Signed)
Pt removed all belongings and gave them to his wife.

## 2021-05-30 NOTE — Discharge Instructions (Addendum)
[facility] °LAPAROSCOPIC SURGERY: POST OP INSTRUCTIONS °Always review your discharge instruction sheet given to you by the facility where your surgery was performed. °IF YOU HAVE DISABILITY OR FAMILY LEAVE FORMS, YOU MUST BRING THEM TO THE OFFICE FOR PROCESSING.   °DO NOT GIVE THEM TO YOUR DOCTOR. ° °PAIN CONTROL ° °First take acetaminophen (Tylenol) AND/or ibuprofen (Advil) to control your pain after surgery.  Follow directions on package.  Taking acetaminophen (Tylenol) and/or ibuprofen (Advil) regularly after surgery will help to control your pain and lower the amount of prescription pain medication you may need.  You should not take more than 3,000 mg (3 grams) of acetaminophen (Tylenol) in 24 hours.  You should not take ibuprofen (Advil), aleve, motrin, naprosyn or other NSAIDS if you have a history of stomach ulcers or chronic kidney disease.  °A prescription for pain medication may be given to you upon discharge.  Take your pain medication as prescribed, if you still have uncontrolled pain after taking acetaminophen (Tylenol) or ibuprofen (Advil). °Use ice packs to help control pain. °If you need a refill on your pain medication, please contact your pharmacy.  They will contact our office to request authorization. Prescriptions will not be filled after 5pm or on week-ends. ° °HOME MEDICATIONS °Take your usually prescribed medications unless otherwise directed. ° °DIET °You should follow a light diet the first few days after arrival home.  Be sure to include lots of fluids daily. Avoid fatty, fried foods.  ° °CONSTIPATION °It is common to experience some constipation after surgery and if you are taking pain medication.  Increasing fluid intake and taking a stool softener (such as Colace) will usually help or prevent this problem from occurring.  A mild laxative (Milk of Magnesia or Miralax) should be taken according to package instructions if there are no bowel movements after 48  hours. ° °WOUND/INCISION CARE °Most patients will experience some swelling and bruising in the area of the incisions.  Ice packs will help.  Swelling and bruising can take several days to resolve.  °Unless discharge instructions indicate otherwise, follow guidelines below  °STERI-STRIPS - you may remove your outer bandages 48 hours after surgery, and you may shower at that time.  You have steri-strips (small skin tapes) in place directly over the incision.  These strips should be left on the skin for 7-10 days.   °DERMABOND/SKIN GLUE - you may shower in 24 hours.  The glue will flake off over the next 2-3 weeks. °Any sutures or staples will be removed at the office during your follow-up visit. ° °ACTIVITIES °You may resume regular (light) daily activities beginning the next day--such as daily self-care, walking, climbing stairs--gradually increasing activities as tolerated.  You may have sexual intercourse when it is comfortable.  Refrain from any heavy lifting or straining until approved by your doctor. °You may drive when you are no longer taking prescription pain medication, you can comfortably wear a seatbelt, and you can safely maneuver your car and apply brakes. ° °FOLLOW-UP °You should see your doctor in the office for a follow-up appointment approximately 2-3 weeks after your surgery.  You should have been given your post-op/follow-up appointment when your surgery was scheduled.  If you did not receive a post-op/follow-up appointment, make sure that you call for this appointment within a day or two after you arrive home to insure a convenient appointment time. ° °WHEN TO CALL YOUR DOCTOR: °Fever over 101.0 °Inability to urinate °Continued bleeding from incision. °Increased   pain, redness, or drainage from the incision. Increasing abdominal pain  The clinic staff is available to answer your questions during regular business hours.  Please dont hesitate to call and ask to speak to one of the nurses for  clinical concerns.  If you have a medical emergency, go to the nearest emergency room or call 911.  A surgeon from [facility] is always on call at the hospital. [address], [place], [place], [place]  [address] ? [address], [place], [place]   [address] [phone] ? [phone] ? FAX [phone] Web site: [email or web address]      Managing Your Pain After Surgery Without Opioids    Thank you for participating in our program to help patients manage their pain after surgery without opioids. This is part of our effort to provide you with the best care possible, without exposing you or your family to the risk that opioids pose.  What pain can I expect after surgery? You can expect to have some pain after surgery. This is normal. The pain is typically worse the day after surgery, and quickly begins to get better. Many studies have found that many patients are able to manage their pain after surgery with Over-the-Counter (OTC) medications such as Tylenol and Motrin. If you have a condition that does not allow you to take Tylenol or Motrin, notify your surgical team.  How will I manage my pain? The best strategy for controlling your pain after surgery is around the clock pain control with Tylenol (acetaminophen) and Motrin (ibuprofen or Advil). Alternating these medications with each other allows you to maximize your pain control. In addition to Tylenol and Motrin, you can use heating pads or ice packs on your incisions to help reduce your pain.  How will I alternate your regular strength over-the-counter pain medication? You will take a dose of pain medication every three hours. Start by taking 650 mg of Tylenol (2 pills of 325 mg) 3 hours later take 600 mg of Motrin (3 pills of 200 mg) 3 hours after taking the Motrin take 650 mg of Tylenol 3 hours after that take 600 mg of Motrin.   - 1 -  See example - if your first dose of Tylenol is at 12:00  PM   12:00 PM Tylenol 650 mg (2 pills of 325 mg)  3:00 PM Motrin 600 mg (3 pills of 200 mg)  6:00 PM Tylenol 650 mg (2 pills of 325 mg)  9:00 PM Motrin 600 mg (3 pills of 200 mg)  Continue alternating every 3 hours   We recommend that you follow this schedule around-the-clock for at least 3 days after surgery, or until you feel that it is no longer needed. Use the table on the last page of this handout to keep track of the medications you are taking. Important: Do not take more than 3000mg  of Tylenol or 1600mg  of Motrin in a 24-hour period. Do not take ibuprofen/Motrin if you have a history of bleeding stomach ulcers, severe kidney disease, &/or actively taking a blood thinner  What if I still have pain? If you have pain that is not controlled with the over-the-counter pain medications (Tylenol and Motrin or Advil) you might have what we call breakthrough pain. You will receive a prescription for a small amount of an opioid pain medication such as Oxycodone, Tramadol, or Tylenol with Codeine. Use these opioid pills in the first 24 hours after surgery if you have breakthrough pain. Do not  take more than 1 pill every 4-6 hours. ° °If you still have uncontrolled pain after using all opioid pills, don't hesitate to call our staff using the number provided. We will help make sure you are managing your pain in the best way possible, and if necessary, we can provide a prescription for additional pain medication. ° ° °Day 1   ° °Time  °Name of Medication Number of pills taken  °Amount of Acetaminophen  °Pain Level  ° °Comments  °AM PM       °AM PM       °AM PM       °AM PM       °AM PM       °AM PM       °AM PM       °AM PM       °Total Daily amount of Acetaminophen °Do not take more than  3,000 mg per day    ° ° °Day 2   ° °Time  °Name of Medication Number of pills °taken  °Amount of Acetaminophen  °Pain Level  ° °Comments  °AM PM       °AM PM       °AM PM       °AM PM       °AM PM       °AM PM       °AM  PM       °AM PM       °Total Daily amount of Acetaminophen °Do not take more than  3,000 mg per day    ° ° °Day 3   ° °Time  °Name of Medication Number of pills taken  °Amount of Acetaminophen  °Pain Level  ° °Comments  °AM PM       °AM PM       °AM PM       °AM PM       ° ° ° °AM PM       °AM PM       °AM PM       °AM PM       °Total Daily amount of Acetaminophen °Do not take more than  3,000 mg per day    ° ° °Day 4   ° °Time  °Name of Medication Number of pills taken  °Amount of Acetaminophen  °Pain Level  ° °Comments  °AM PM       °AM PM       °AM PM       °AM PM       °AM PM       °AM PM       °AM PM       °AM PM       °Total Daily amount of Acetaminophen °Do not take more than  3,000 mg per day    ° ° °Day 5   ° °Time  °Name of Medication Number °of pills taken  °Amount of Acetaminophen  °Pain Level  ° °Comments  °AM PM       °AM PM       °AM PM       °AM PM       °AM PM       °AM PM       °AM PM       °AM PM       °Total Daily amount of Acetaminophen °Do not take more than  3,000   mg per day       Day 6    Time  Name of Medication Number of pills taken  Amount of Acetaminophen  Pain Level  Comments  AM PM       AM PM       AM PM       AM PM       AM PM       AM PM       AM PM       AM PM       Total Daily amount of Acetaminophen Do not take more than  3,000 mg per day      Day 7    Time  Name of Medication Number of pills taken  Amount of Acetaminophen  Pain Level   Comments  AM PM       AM PM       AM PM       AM PM       AM PM       AM PM       AM PM       AM PM       Total Daily amount of Acetaminophen Do not take more than  3,000 mg per day        For additional information about how and where to safely dispose of unused opioid medications - [employer]  Disclaimer: This document contains information and/or instructional materials adapted from [place] for the typical patient with your condition. It does not replace medical advice  from your health care provider because your experience may differ from that of the typical patient. Talk to your health care provider if you have any questions about this document, your condition or your treatment plan. Adapted from [place]

## 2021-05-30 NOTE — H&P (Signed)
Admission Note  Dennis Wong 01-May-1965  [id number].    Requesting MD: Dorie Rank, MD Chief Complaint/Reason for Consult: cholelithiasis  Patient is an otherwise healthy 56 year old male who presented to [facility] with abdominal pain. Pain was acute onset in RUQ around 1 AM today. Patient reports eating pizza and wings for dinner last night. Pain was sharp and he has never had pain similar to this previously. Pain is now more localized to epigastrium. He had one episode of bilious emesis this AM. Denies fever, chills, diarrhea, urinary symptoms, chest pain, SOB. He has not had any previous abdominal surgery. NKDA. He occasionally vapes and denies alcohol or illicit drug use. He is married and his wife is at bedside. [place] an [place]   ROS: Review of Systems  Constitutional:  Negative for chills and fever.  Respiratory:  Negative for shortness of breath and wheezing.   Cardiovascular:  Negative for chest pain and palpitations.  Gastrointestinal:  Positive for abdominal pain, nausea and vomiting. Negative for constipation and diarrhea.  Genitourinary:  Negative for dysuria, frequency and urgency.  All other systems reviewed and are negative.  No family history on file.  Past Medical History:  Diagnosis Date   Kidney stone     History reviewed. No pertinent surgical history.  Social History:  reports that he has been smoking. He has never used smokeless tobacco. He reports that he does not drink alcohol. No history on file for drug use.  Allergies: No Known Allergies  (Not in a hospital admission)   Blood pressure (!) 155/100, pulse 65, temperature 97.6 F (36.4 C), temperature source Oral, resp. rate 17, height 6' (1.829 m), weight 108.9 kg, SpO2 91 %. Physical Exam:  General: pleasant, WD, overweight male who is laying in bed in NAD HEENT: head is normocephalic, atraumatic.  Sclera are anicteric.  Ears and nose without any masses or lesions.  Mouth is pink and  moist Heart: regular, rate, and rhythm.  Normal s1,s2. No obvious murmurs, gallops, or rubs noted.  Palpable radial and pedal pulses bilaterally Lungs: CTAB, no wheezes, rhonchi, or rales noted.  Respiratory effort nonlabored Abd: soft, ttp in epigastric abdomen without peritonitis, negative murphy sign, ND, +BS, no masses, hernias, or organomegaly MS: all 4 extremities are symmetrical with no cyanosis, clubbing, or edema. Skin: warm and dry with no masses, lesions, or rashes Neuro: Cranial nerves 2-12 grossly intact, sensation is normal throughout Psych: A&Ox3 with an appropriate affect.   Results for orders placed or performed during the hospital encounter of 05/30/21 (from the past 48 hour(s))  Comprehensive metabolic panel     Status: Abnormal   Collection Time: 05/30/21  6:25 AM  Result Value Ref Range   Sodium 136 135 - 145 mmol/L   Potassium 4.3 3.5 - 5.1 mmol/L   Chloride 104 98 - 111 mmol/L   CO2 23 22 - 32 mmol/L   Glucose, Bld 167 (H) 70 - 99 mg/dL    Comment: Glucose reference range applies only to samples taken after fasting for at least 8 hours.   BUN 12 6 - 20 mg/dL   Creatinine, Ser 0.81 0.61 - 1.24 mg/dL   Calcium 9.4 8.9 - 10.3 mg/dL   Total Protein 6.5 6.5 - 8.1 g/dL   Albumin 4.2 3.5 - 5.0 g/dL   AST 23 15 - 41 U/L   ALT 14 0 - 44 U/L   Alkaline Phosphatase 85 38 - 126 U/L   Total  Bilirubin 1.3 (H) 0.3 - 1.2 mg/dL   GFR, Estimated >60 >60 mL/min    Comment: (NOTE) Calculated using the CKD-EPI Creatinine Equation (2021)    Anion gap 9 5 - 15    Comment: Performed at [facility] [address] [place], [place] [address]  Lipase, blood     Status: None   Collection Time: 05/30/21  6:25 AM  Result Value Ref Range   Lipase 34 11 - 51 U/L    Comment: Performed at [facility] [address] [place], [place] [address]  CBC with Differential     Status: None   Collection Time: 05/30/21  6:25 AM  Result Value Ref Range   WBC 6.9 4.0 - 10.5 K/uL    RBC 5.25 4.22 - 5.81 MIL/uL   Hemoglobin 15.1 13.0 - 17.0 g/dL   HCT 43.8 39.0 - 52.0 %   MCV 83.4 80.0 - 100.0 fL   MCH 28.8 26.0 - 34.0 pg   MCHC 34.5 30.0 - 36.0 g/dL   RDW 14.1 11.5 - 15.5 %   Platelets 195 150 - 400 K/uL   nRBC 0.0 0.0 - 0.2 %   Neutrophils Relative % 74 %   Neutro Abs 5.1 1.7 - 7.7 K/uL   Lymphocytes Relative 18 %   Lymphs Abs 1.2 0.7 - 4.0 K/uL   Monocytes Relative 6 %   Monocytes Absolute 0.4 0.1 - 1.0 K/uL   Eosinophils Relative 1 %   Eosinophils Absolute 0.1 0.0 - 0.5 K/uL   Basophils Relative 1 %   Basophils Absolute 0.1 0.0 - 0.1 K/uL   Immature Granulocytes 0 %   Abs Immature Granulocytes 0.03 0.00 - 0.07 K/uL    Comment: Performed at [facility], [address] [place], [place] [address]   US Abdomen Limited RUQ (LIVER/GB)  Result Date: 05/30/2021 CLINICAL DATA:  Right upper quadrant pain EXAM: ULTRASOUND ABDOMEN LIMITED RIGHT UPPER QUADRANT COMPARISON:  None available FINDINGS: Gallbladder: Shadowing gallstones. Coment tail reflectors scattered in the gallbladder wall which measures 2-3 mm. Gallbladder region tenderness but no pericholecystic edema or over distention. Common bile duct: Diameter: 6-7 mm.  Where visualized, no filling defect. Liver: No focal lesion identified. Within normal limits in parenchymal echogenicity. Portal vein is patent on color Doppler imaging with normal direction of blood flow towards the liver. IMPRESSION: 1. Cholelithiasis. Gallbladder tenderness but no wall thickening or over distension typical of acute cholecystitis. 2. Adenomyomatosis. Electronically Signed   By: Jorje Guild M.D.   On: 05/30/2021 07:07      Assessment/Plan RUQ/epigastric abdominal pain  Possible acute cholecystitis  - [place] with Cholelithiasis and no signs of acute cholecystitis - no leukocytosis and afebrile  - AST/ALT and alk phos all normal, Tbili mildly elevated at 1.3 - pt with persistent pain, more epigastric rather than RUQ,  negative murphy sign - discussed with MD, recommend proceeding to OR for laparoscopic cholecystectomy with IOC  FEN: NPO, IVF VTE: none  ID: [email or web address]   Pending intra-operative findings, may be able to discharge from PACU post-operatively. If requires admission, will admit to observation post-operatively.   I reviewed ED provider notes, last 24 h vitals and pain scores, last 48 h intake and output, last 24 h labs and trends, and last 24 h imaging results.  This care required moderate level of medical decision making.   Norm Parcel, [facility] 05/30/2021, 10:47 AM Please see [facility] for pager number during day  hours 7:00am-4:30pm

## 2021-05-30 NOTE — ED Notes (Signed)
Obtained consent for procedure 

## 2021-05-30 NOTE — Transfer of Care (Signed)
Immediate Anesthesia Transfer of Care Note  Patient: Dennis Wong  Procedure(s) Performed: LAPAROSCOPIC CHOLECYSTECTOMY WITH ICG GREEN DYE (Abdomen) INTRAOPERATIVE CHOLANGIOGRAM (Abdomen)  Patient Location: PACU  Anesthesia Type:General  Level of Consciousness: drowsy and patient cooperative  Airway & Oxygen Therapy: Patient Spontanous Breathing  Post-op Assessment: Report given to RN and Post -op Vital signs reviewed and stable  Post vital signs: Reviewed and stable  Last Vitals:  Vitals Value Taken Time  BP 146/79 05/30/21 1353  Temp    Pulse 81 05/30/21 1358  Resp 10 05/30/21 1358  SpO2 93 % 05/30/21 1358  Vitals shown include unvalidated device data.  Last Pain:  Vitals:   05/30/21 1153  TempSrc: Oral  PainSc: 5          Complications: No notable events documented.

## 2021-05-31 ENCOUNTER — Encounter (HOSPITAL_COMMUNITY): Payer: Self-pay | Admitting: General Surgery

## 2021-05-31 LAB — SURGICAL PATHOLOGY

## 2021-05-31 NOTE — Anesthesia Postprocedure Evaluation (Signed)
Anesthesia Post Note  Patient: Dennis Wong  Procedure(s) Performed: LAPAROSCOPIC CHOLECYSTECTOMY WITH ICG GREEN DYE (Abdomen) INTRAOPERATIVE CHOLANGIOGRAM (Abdomen)     Patient location during evaluation: PACU Anesthesia Type: General Level of consciousness: awake and alert Pain management: pain level controlled Vital Signs Assessment: post-procedure vital signs reviewed and stable Respiratory status: spontaneous breathing, nonlabored ventilation, respiratory function stable and patient connected to nasal cannula oxygen Cardiovascular status: blood pressure returned to baseline and stable Postop Assessment: no apparent nausea or vomiting Anesthetic complications: no   No notable events documented.  Last Vitals:  Vitals:   05/30/21 1424 05/30/21 1439  BP: 134/83 136/81  Pulse: 82 77  Resp: 19 12  Temp:  36.6 C  SpO2: 98% 94%    Last Pain:  Vitals:   05/30/21 1424  TempSrc:   PainSc: Asleep                 Omari Mcmanaway

## 2022-12-01 IMAGING — US US ABDOMEN LIMITED
1 series · 14 of 25 positions shown · non-contrast
Comparison: None available

CLINICAL DATA: Right upper quadrant pain

EXAM:
ULTRASOUND ABDOMEN LIMITED RIGHT UPPER QUADRANT

[Series 1: us abdomen limited ruq (liver/gb) · 14 of 49 slices shown]
[im 1/49]
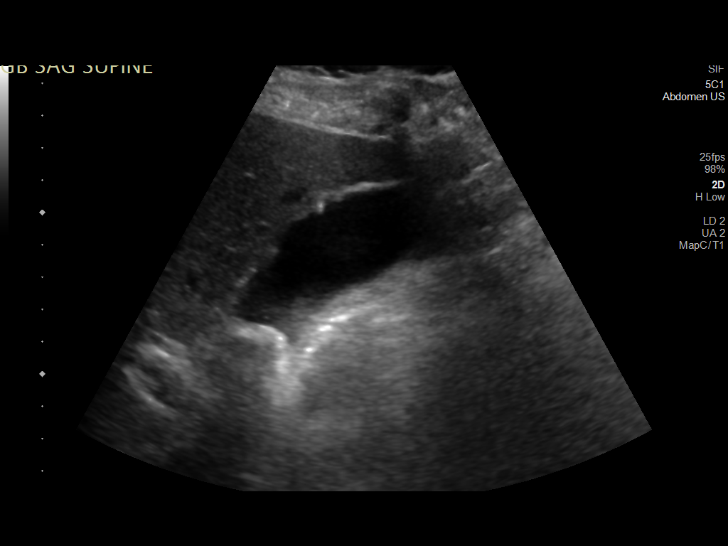
[im 5/49]
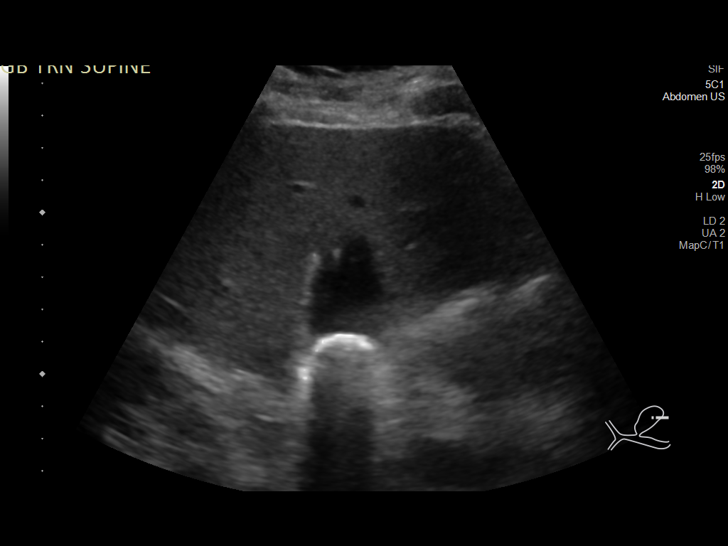
[im 9/49]
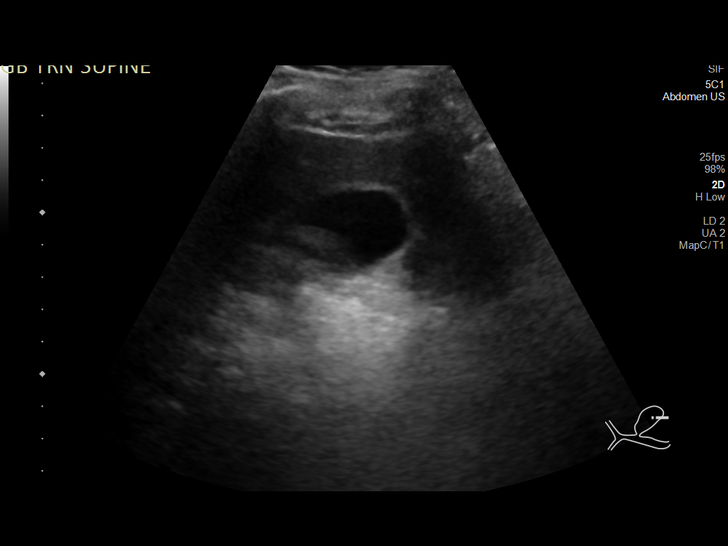
[im 13/49]
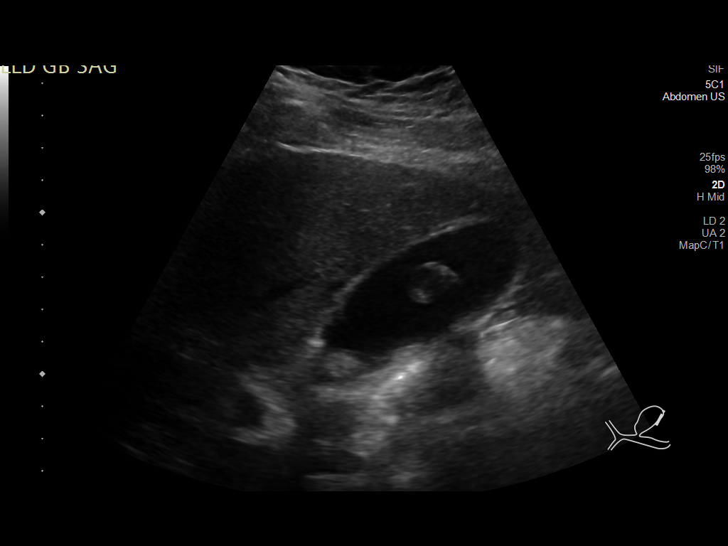
[im 17/49]
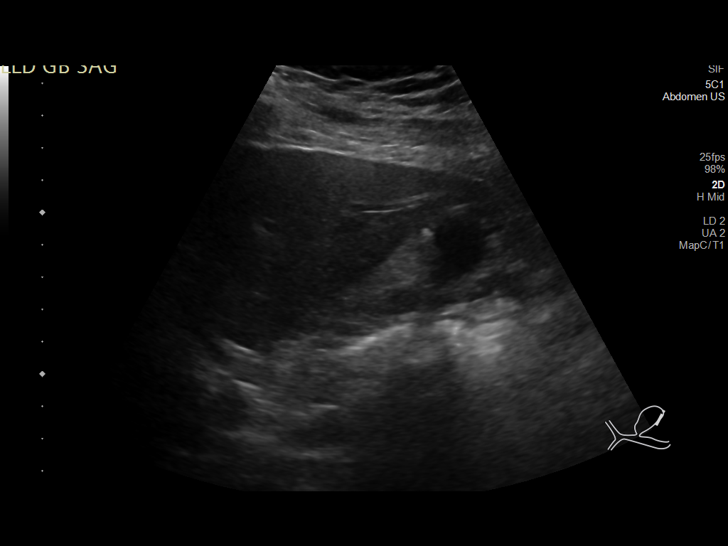
[im 19/49]
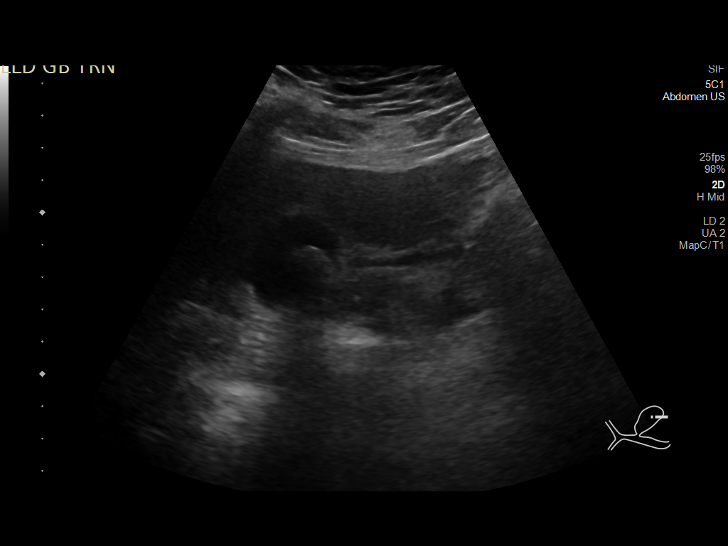
[im 23/49]
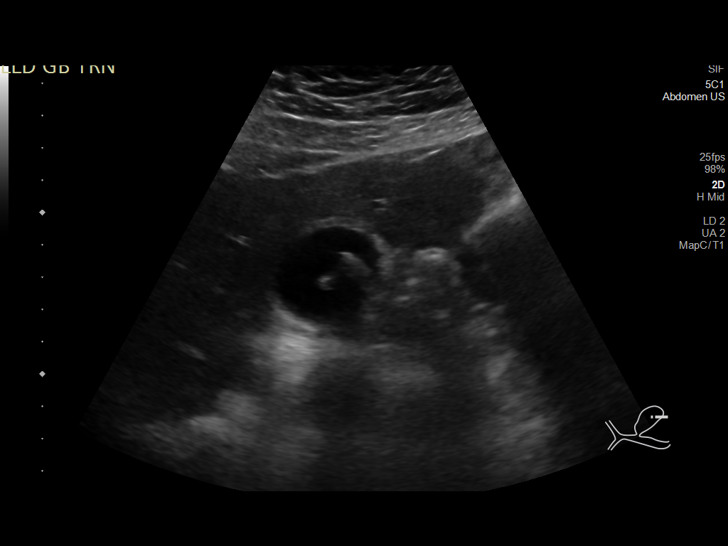
[im 27/49]
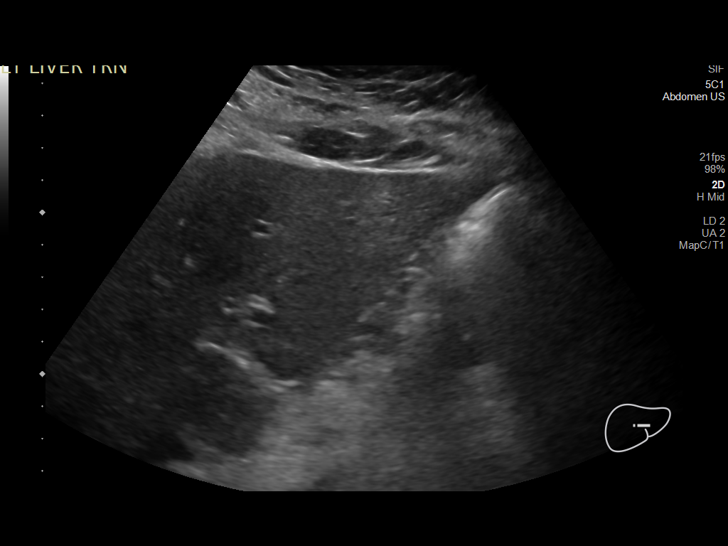
[im 31/49]
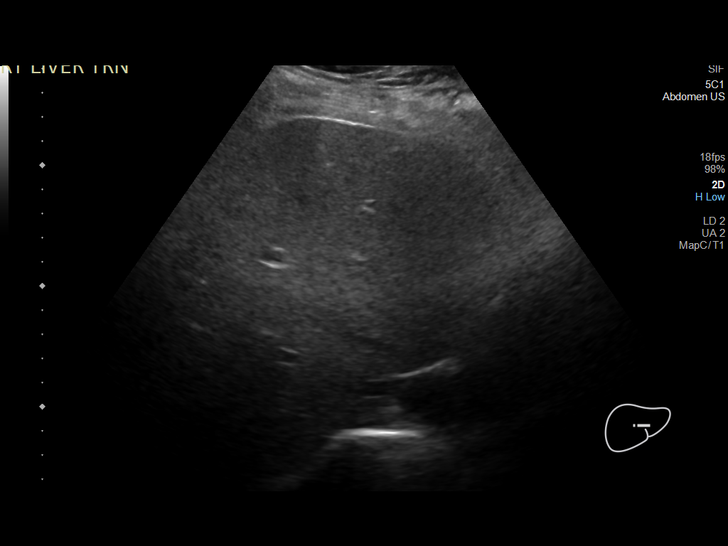
[im 33/49]
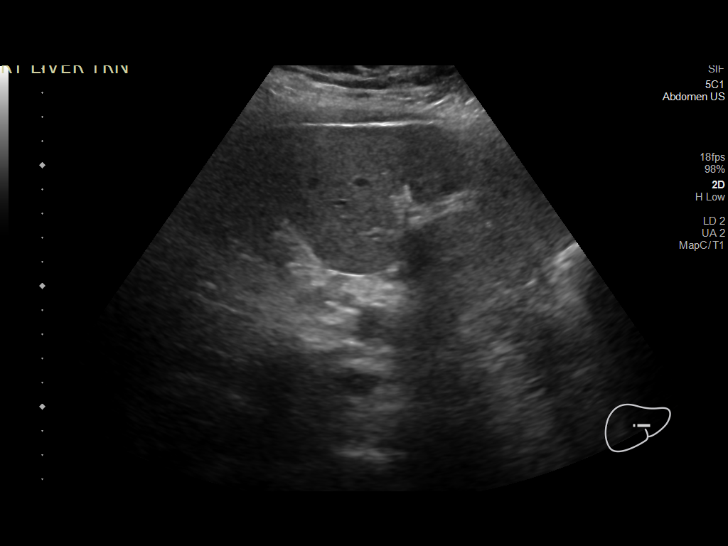
[im 37/49]
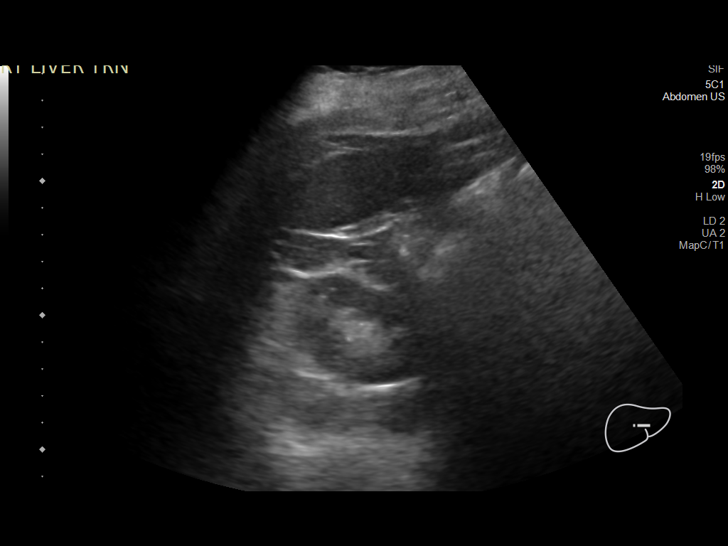
[im 41/49]
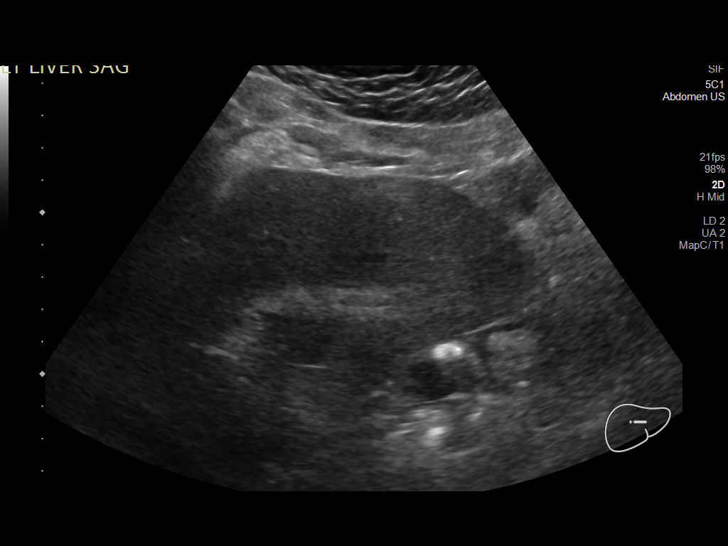
[im 45/49]
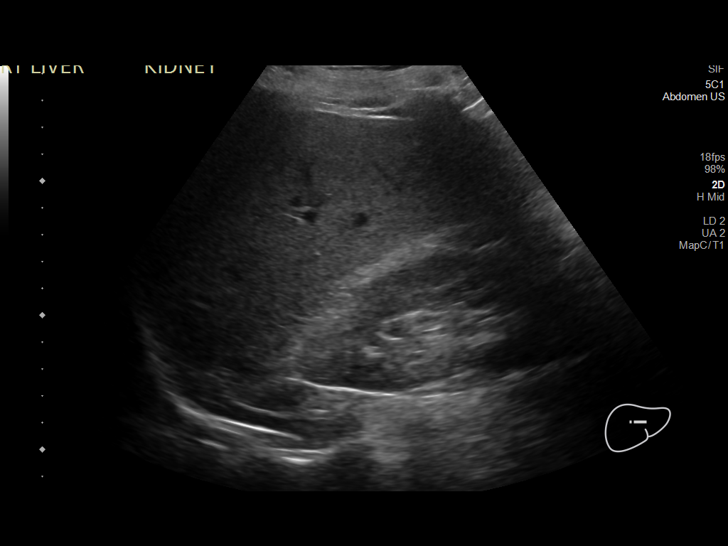
[im 49/49]
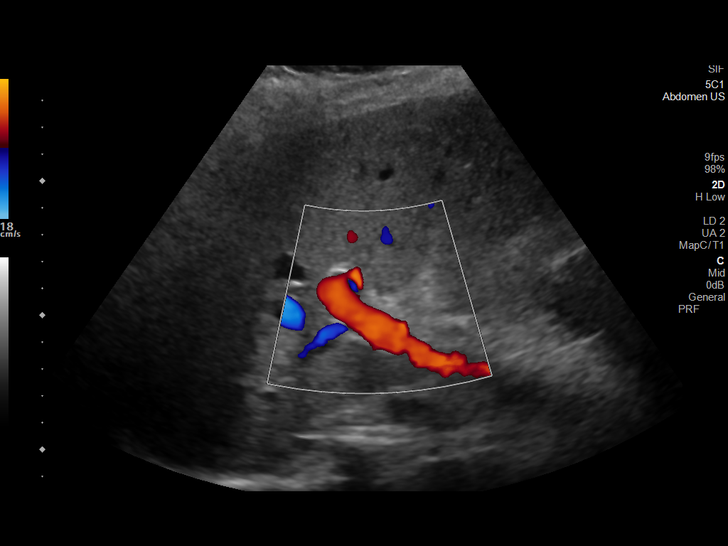

[14 of 25 positions shown; findings below may reference images not displayed]

FINDINGS: Gallbladder:

Shadowing gallstones. Coment tail reflectors scattered in the
gallbladder wall which measures 2-3 mm. Gallbladder region
tenderness but no pericholecystic edema or over distention.

Common bile duct:

Diameter: 6-7 mm.  Where visualized, no filling defect.

Liver:

No focal lesion identified. Within normal limits in parenchymal
echogenicity. Portal vein is patent on color Doppler imaging with
normal direction of blood flow towards the liver.
IMPRESSION: 1. Cholelithiasis. Gallbladder tenderness but no wall thickening or
over distension typical of acute cholecystitis.
2. Adenomyomatosis.

## 2023-11-11 ENCOUNTER — Emergency Department (HOSPITAL_BASED_OUTPATIENT_CLINIC_OR_DEPARTMENT_OTHER)

## 2023-11-11 ENCOUNTER — Other Ambulatory Visit: Payer: Self-pay

## 2023-11-11 ENCOUNTER — Encounter (HOSPITAL_BASED_OUTPATIENT_CLINIC_OR_DEPARTMENT_OTHER): Payer: Self-pay | Admitting: Emergency Medicine

## 2023-11-11 ENCOUNTER — Emergency Department (HOSPITAL_BASED_OUTPATIENT_CLINIC_OR_DEPARTMENT_OTHER): Admission: EM | Admit: 2023-11-11 | Discharge: 2023-11-11 | Disposition: A | Discharge status: Home / Self Care

## 2023-11-11 DIAGNOSIS — S52572A Other intraarticular fracture of lower end of left radius, initial encounter for closed fracture: Secondary | ICD-10-CM | POA: Diagnosis not present

## 2023-11-11 DIAGNOSIS — W11XXXA Fall on and from ladder, initial encounter: Secondary | ICD-10-CM | POA: Diagnosis not present

## 2023-11-11 DIAGNOSIS — M25532 Pain in left wrist: Secondary | ICD-10-CM | POA: Diagnosis present

## 2023-11-11 MED ORDER — IBUPROFEN 800 MG PO TABS: 800.0000 mg | ORAL_TABLET | Freq: Three times a day (TID) | ORAL | 0 refills | Status: AC

## 2023-11-11 MED ORDER — OXYCODONE HCL 5 MG PO TABS
5.0000 mg | ORAL_TABLET | Freq: Once | ORAL | Status: AC
  Administered 2023-11-11: 5 mg via ORAL
  Filled 2023-11-11: qty 1

## 2023-11-11 MED ORDER — OXYCODONE HCL 5 MG PO TABS: 5.0000 mg | ORAL_TABLET | ORAL | 0 refills | Status: AC | PRN

## 2023-11-11 NOTE — ED Triage Notes (Signed)
 C/o left wrist pain after fall this morning. Denies hitting head.

## 2023-11-11 NOTE — ED Notes (Signed)
 DC paperwork given and verbally understood.

## 2023-11-11 NOTE — ED Provider Notes (Signed)
 [facility] Provider Note   CSN: [id number] Arrival date & time: 11/11/23  1243     Patient presents with: [name]   Dennis Wong is a 58 y.o. male.    Fall Pertinent negatives include no chest pain, no abdominal pain and no shortness of breath.    Presents with fall.  Left wrist pain after fall.  Patient states that fell from first rung of ladder.  Did not hit his head.  Takes no anticoagulation.  No cervical thoracic or lumbar pain.  Patient states he fell on outstretched left hand.  Only source of pain is currently left wrist area.  No numbness or tingling anywhere.     Prior to Admission medications   Medication Sig Start Date End Date Taking? Authorizing Provider  ibuprofen  (ADVIL ) 800 MG tablet Take 1 tablet (800 mg total) by mouth 3 (three) times daily. 11/11/23  Yes Simon Lavonia SAILOR, MD  oxyCODONE  (ROXICODONE ) 5 MG immediate release tablet Take 1 tablet (5 mg total) by mouth every 4 (four) hours as needed for severe pain (pain score 7-10). 11/11/23  Yes Simon Lavonia SAILOR, MD  acetaminophen  (TYLENOL ) 500 MG tablet Take 2 tablets (1,000 mg total) by mouth every 8 (eight) hours as needed. 05/30/21   Maczis, Michael M, PA-C  etodolac  (LODINE ) 400 MG tablet Take 1 tablet (400 mg total) by mouth 2 (two) times daily as needed. 01/14/18   Hilts, Ozell, MD  nabumetone  (RELAFEN ) 500 MG tablet Take 1 tablet (500 mg total) by mouth 2 (two) times daily as needed. 01/29/18   Hilts, Ozell, MD  oxyCODONE  (ROXICODONE ) 5 MG immediate release tablet Take 1 tablet (5 mg total) by mouth every 6 (six) hours as needed for breakthrough pain. 05/30/21   Maczis, Michael M, PA-C  Tamsulosin  HCl (FLOMAX ) 0.4 MG CAPS Take 1 capsule (0.4 mg total) by mouth daily. 12/25/11   Vincente Lynwood BIRCH, MD  Vitamin D , Ergocalciferol , (DRISDOL ) 50000 units CAPS capsule Take 1 capsule (50,000 Units total) by mouth every 7 (seven) days. 1 capsule once a week for 8 weeks. 02/19/18   Addie Cordella Hamilton, MD    Allergies: Patient has no known allergies.    Review of Systems  Constitutional:  Negative for chills and fever.  HENT:  Negative for ear pain and sore throat.   Eyes:  Negative for pain and visual disturbance.  Respiratory:  Negative for cough and shortness of breath.   Cardiovascular:  Negative for chest pain and palpitations.  Gastrointestinal:  Negative for abdominal pain and vomiting.  Genitourinary:  Negative for dysuria and hematuria.  Musculoskeletal:  Negative for arthralgias and back pain.  Skin:  Negative for color change and rash.  Neurological:  Negative for seizures and syncope.  All other systems reviewed and are negative.   Updated Vital Signs BP (!) 151/104 (BP Location: Right Arm)   Pulse 78   Temp 98.4 F (36.9 C) (Oral)   Resp 18   SpO2 99%   Physical Exam Vitals and nursing note reviewed.  Constitutional:      General: He is not in acute distress.    Appearance: He is well-developed.  HENT:     Head: Normocephalic and atraumatic.  Eyes:     Conjunctiva/sclera: Conjunctivae normal.  Cardiovascular:     Rate and Rhythm: Normal rate and regular rhythm.     Heart sounds: No murmur heard. Pulmonary:     Effort: Pulmonary effort is normal. No respiratory distress.  Breath sounds: Normal breath sounds.  Abdominal:     Palpations: Abdomen is soft.     Tenderness: There is no abdominal tenderness.  Musculoskeletal:        General: No swelling.       Hands:     Cervical back: Neck supple.  Skin:    General: Skin is warm and dry.     Capillary Refill: Capillary refill takes less than 2 seconds.  Neurological:     Mental Status: He is alert.  Psychiatric:        Mood and Affect: Mood normal.     (all labs ordered are listed, but only abnormal results are displayed) Labs Reviewed - No data to display  EKG: None  Radiology: DG Hand Complete Left Result Date: 11/11/2023 CLINICAL DATA:  Fall.  Left hand pain. EXAM: LEFT  HAND - COMPLETE 3+ VIEW COMPARISON:  Left wrist radiographs obtained today. FINDINGS: There is no evidence of fracture or dislocation involving the hand. Comminuted fracture of the distal radius is noted but better evaluated on separate wrist radiographs obtained today. IMPRESSION: No evidence of hand fracture or dislocation. Comminuted fracture of distal radius, better evaluated on separate wrist radiographs obtained today. Electronically Signed   By: Norleen DELENA Kil M.D.   On: 11/11/2023 13:45   DG Wrist Complete Left Result Date: 11/11/2023 CLINICAL DATA:  Fall.  Left wrist pain. EXAM: LEFT WRIST - COMPLETE 3+ VIEW COMPARISON:  None Available. FINDINGS: Comminuted fracture of the distal radial metaphysis is seen with intra-articular extension into the radiocarpal joint. No other fractures identified. No evidence of dislocation. IMPRESSION: Comminuted fracture of distal radial metaphysis with intra-articular extension. Electronically Signed   By: Norleen DELENA Kil M.D.   On: 11/11/2023 13:44     Procedures   Medications Ordered in the ED  oxyCODONE  (Oxy IR/ROXICODONE ) immediate release tablet 5 mg (has no administration in time range)                                    Medical Decision Making Amount and/or Complexity of Data Reviewed Radiology: ordered.  Risk Prescription drug management.   Presents with fall.  Left wrist pain after fall.  Patient states that fell from first rung of ladder.  Did not hit his head.  Takes no anticoagulation.  No cervical thoracic or lumbar pain.  Patient states he fell on outstretched left hand.  Only source of pain is currently left wrist area.  No numbness or tingling anywhere.  Upon exam, patient hemodynamically stable.  ANO x 3 GCS 15.  No cervical thoracic or lumbar pain to palpation.  No indication for CT imaging of vertebral bodies.  No concerns for fracture.  Takes no anticoagulation.  Did not strike head.  No indication for CT.   Patient did have pain  to palpation of the left wrist.  Did obtain x-ray.  Shows intra-articular distal radius fracture.  No dislocation.  No indication for reduction.  Sugar-tong splint was applied.  Patient remained neurologically intact both before and after splint application.  Normal perfusion distal phalanges.  Sensation intact in all dermatomes.  Strength intact.   He will follow-up with hand surgery outpatient.           Final diagnoses:  Other closed intra-articular fracture of distal end of left radius, initial encounter    ED Discharge Orders          Ordered  oxyCODONE  (ROXICODONE ) 5 MG immediate release tablet  Every 4 hours PRN        11/11/23 1406    ibuprofen  (ADVIL ) 800 MG tablet  3 times daily        11/11/23 1406               Simon Lavonia SAILOR, MD 11/11/23 1422

## 2023-11-11 NOTE — Discharge Instructions (Addendum)
 Please follow-up with hand surgery.  Please give them a call on Monday to establish care.  For pain, you can take ibuprofen  prescription strength.  Do not take Aleve with this.  You can also take Tylenol  1000 mg or 1 g every 6-8 hours.  Do not exceed more than 4000 mg or 4 g of Tylenol  in a 24-hour period  For breakthrough pain, you can take oxycodone .  Do not take this with alcohol.  Do not take this and drive.

## 2023-11-12 ENCOUNTER — Ambulatory Visit
Admission: RE | Admit: 2023-11-12 | Discharge: 2023-11-12 | Disposition: A | Discharge status: Home / Self Care | Source: Ambulatory Visit | Attending: Orthopedic Surgery | Admitting: Orthopedic Surgery

## 2023-11-12 ENCOUNTER — Encounter: Payer: Self-pay | Admitting: Orthopedic Surgery

## 2023-11-12 ENCOUNTER — Ambulatory Visit: Admitting: Orthopedic Surgery

## 2023-11-12 DIAGNOSIS — S6992XA Unspecified injury of left wrist, hand and finger(s), initial encounter: Secondary | ICD-10-CM

## 2023-11-12 MED ORDER — OXYCODONE HCL 5 MG PO TABS: ORAL_TABLET | ORAL | 0 refills | Status: AC

## 2023-11-12 NOTE — Progress Notes (Unsigned)
 Office Visit Note   Patient: [facility]           Date of Birth: 05/12/1965           MRN: [id number] Visit Date: 11/12/2023 Requested by: No referring provider defined for this encounter. PCP: Patient, No Pcp Per  Subjective: Chief Complaint  Patient presents with   Left Wrist - Fracture    DOI: 11/11/23--fell from bottom rung of ladder    HPI: Dennis Wong is a 58 y.o. male who presents to the office reporting left wrist pain.  Patient is right-hand dominant.  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  He reached down to break a fall on 11/11/2023 and landed on his left hand.  Denies any elbow or shoulder pain.  Radiographs demonstrate intra-articular distal radius fracture volar Barton's type.  He does use both hands a lot at work..                ROS: All systems reviewed are negative as they relate to the chief complaint within the history of present illness.  Patient denies fevers or chills.  Assessment & Plan: Visit Diagnoses:  1. Injury of left wrist, initial encounter     Plan: Impression is left distal radius fracture.  CT scan performed by the time of this dictation.  Does have intra articular extension as well as an unstable volar fragment.  Patient would do best with open reduction internal fixation to prevent further volar migration and proximal migration of the volar fragment.  Intra-articular work could also be done to restore the joint surface as best possible.  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  Plan for surgical intervention in the near future.  New splint was applied today and we encouraged patient to elevate the hand is much as possible.  Follow-Up Instructions: No follow-ups on file.   Orders:  Orders Placed This Encounter  Procedures   CT WRIST LEFT WO CONTRAST   Meds ordered this encounter  Medications   oxyCODONE  (ROXICODONE ) 5 MG immediate release tablet    Sig: 1 po q 4 hrs prn  pain    Dispense:  30 tablet    Refill:  0      Procedures: No procedures performed   Clinical Data: No additional findings.  Objective: Vital Signs: There were no vitals taken for this visit.  Physical Exam:  Constitutional: Patient appears well-developed HEENT:  Head: Normocephalic Eyes:EOM are normal Neck: Normal range of motion Cardiovascular: Normal rate Pulmonary/chest: Effort normal Neurologic: Patient is alert Skin: Skin is warm Psychiatric: Patient has normal mood and affect  Ortho Exam: Ortho exam demonstrates intact EPL FPL interosseous function.  Left elbow range of motion is full.  No masses lymphadenopathy or skin changes noted in that left elbow region.  Does have some swelling around the wrist.  We were able to remove his ring today with the help of Tory Gaskins, PA..  Radial pulse intact.  No paresthesias on the dorsal or palmar aspect of the hand.  Specialty Comments:  No specialty comments available.  Imaging: CT WRIST LEFT WO CONTRAST Result Date: 11/12/2023 CLINICAL DATA:  Fall.  Left wrist fracture evaluation. EXAM: CT OF THE LEFT WRIST WITHOUT CONTRAST TECHNIQUE: Multidetector CT imaging was performed according to the standard protocol. Multiplanar CT image reconstructions were also generated. RADIATION DOSE REDUCTION: This exam was performed according to the departmental dose-optimization program which includes automated exposure control, adjustment of  the mA and/or kV according to patient size and/or use of iterative reconstruction technique. COMPARISON:  11/11/2023. FINDINGS: Bones/Joint/Cartilage Comminuted fracture of the distal radial metaphysis with intra-articular extension to the radiocarpal and distal radioulnar joints. There is approximately 7 mm of diastasis involving the distal radial articular surface at the level of the radiocarpal articulation. Approximately 3-4 mm of volar displacement. No additional acute fracture identified. No evidence of  dislocation. Carpal rows are intact and demonstrate normal alignment. Subcortical cystic changes in the scaphoid and lunate. Ligaments Ligaments are suboptimally evaluated by CT. Muscles and Tendons Muscles are normal. No muscle atrophy. No intramuscular fluid collection or hematoma. Soft tissue Soft tissue swelling of the wrist. No fluid collection or hematoma. No soft tissue mass. IMPRESSION: 1. Comminuted intra-articular mildly displaced fracture of the distal radial metaphysis. 2. No additional acute fracture identified. 3. Subcortical cystic changes of the scaphoid and lunate. Electronically Signed   By: Harrietta Sherry M.D.   On: 11/12/2023 16:43     PMFS History: There are no active problems to display for this patient.  Past Medical History:  Diagnosis Date   Kidney stone     No family history on file.  Past Surgical History:  Procedure Laterality Date   CHOLECYSTECTOMY N/A 05/30/2021   Procedure: LAPAROSCOPIC CHOLECYSTECTOMY WITH ICG GREEN DYE;  Surgeon: Tanda Locus, MD;  Location: [facility] OR;  Service: General;  Laterality: N/A;   INTRAOPERATIVE CHOLANGIOGRAM N/A 05/30/2021   Procedure: INTRAOPERATIVE CHOLANGIOGRAM;  Surgeon: Tanda Locus, MD;  Location: [facility] OR;  Service: General;  Laterality: N/A;   Social History   Occupational History   Not on file  Tobacco Use   Smoking status: Former    Current packs/day: 0.00    Types: Cigarettes    Quit date: 04/17/2018    Years since quitting: 5.5   Smokeless tobacco: Never  Substance and Sexual Activity   Alcohol use: No   Drug use: Not on file   Sexual activity: Not on file

## 2023-11-14 ENCOUNTER — Telehealth: Payer: Self-pay | Admitting: Orthopedic Surgery

## 2023-11-14 NOTE — Telephone Encounter (Signed)
 Several dates have been offered to patient for his left distal radius fracture surgery. Patient was seen 10-23-23 and advised to schedule within a week. Surgery was offered 3 days after his injury, but patient declined.  Two other dates were offered.  Patient's wife decided on Tuesday 11-20-23 and said she would try to talk to him.   Patient was advised the surgery should be done sooner rather than later and not advisable to go any longer than 11-20-23.  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 In trying to accommodate the patient, Saturday surgery date was offered.  Patient has still declined.  Patient's wife states he is thinking he might let it heal on it's own. She will call us  back if he changes his mind.

## 2023-11-20 ENCOUNTER — Encounter (HOSPITAL_COMMUNITY): Admission: RE | Payer: Self-pay | Source: Home / Self Care

## 2023-11-20 ENCOUNTER — Ambulatory Visit (HOSPITAL_COMMUNITY): Admission: RE | Admit: 2023-11-20 | Source: Home / Self Care | Admitting: Orthopedic Surgery

## 2023-11-20 SURGERY — OPEN REDUCTION INTERNAL FIXATION (ORIF) RADIAL FRACTURE
Anesthesia: General | Site: Wrist | Laterality: Left

## 2023-11-30 ENCOUNTER — Other Ambulatory Visit (INDEPENDENT_AMBULATORY_CARE_PROVIDER_SITE_OTHER): Payer: Self-pay

## 2023-11-30 ENCOUNTER — Ambulatory Visit (INDEPENDENT_AMBULATORY_CARE_PROVIDER_SITE_OTHER): Admitting: Surgical

## 2023-11-30 DIAGNOSIS — S6992XA Unspecified injury of left wrist, hand and finger(s), initial encounter: Secondary | ICD-10-CM

## 2023-11-30 NOTE — Progress Notes (Signed)
 Post-fracture visit Note   Patient: [facility]           Date of Birth: 1965/09/10           MRN: [id number] Visit Date: 11/30/2023 PCP: Patient, No Pcp Per   Assessment & Plan:  Chief Complaint:  Chief Complaint  Patient presents with   Left Wrist - Fracture, Follow-up    Wants wrist checked-decided against surgery and leaving for [place] Sunday-requesting removable brace b/c splint and cast get too dirty. Still trying to work some.   Visit Diagnoses:  1. Injury of left wrist, initial encounter     Plan: Patient is a 58 year old male who presents for reevaluation of left wrist fracture.  Previously has seen Dr. Addie for distal radius fracture with intra-articular extension.  Had discussed surgery as a treatment option but patient ultimately ended up deciding against this AGAINST MEDICAL ADVICE.  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  He is using the postinjury splint and reapplying this with [employer].  He avoids using his left hand when making food and helping his family with the restaurant.  Overall he does not really have much pain at this point.  No longer taking opioid pain medication.  Just takes one 800 mg ibuprofen before bed.  He has to go to [place] on Sunday in order to visit his dad who is not doing well in terms of his recent health.  On exam, patient has increased swelling of the left wrist relative to the right.  2+ radial pulse of the left upper extremity.  He has intact EPL, FPL, finger abduction.  He is able to make near full composite fist.  There is slight bruising noted around the volar aspect of the wrist.  He has really no tenderness over the dorsum of the wrist and minimal tenderness over the anatomic snuffbox.  Most of his tenderness overlies the volar aspect of the distal radius which is rated moderate.  He has no tenderness over the scaphoid tubercle or elsewhere in the wrist/hand.  Radiographs taken today demonstrate significant angulation of the  volar fracture fragment to the point that the smooth radiocarpal joint arc has become more of a V shape that would almost certainly contribute to substantially reduced wrist flexion/extension and the development of progressive radiocarpal arthritis in the future.  I did discuss this with the patient and he understands the potential consequences of avoiding surgery.  For now he wants to go to visit his dad and request a splint again.  In order to somewhat salvage the healing of this fracture, we will try short arm cast today.  He will need to return in 2 weeks for cast removal and repeat radiographs.  I did clarify that the cast will not realign this fracture and the only thing that will really realign this fracture ultimately would be surgical intervention at this point.  Follow-Up Instructions: No follow-ups on file.   Orders:  Orders Placed This Encounter  Procedures   XR Wrist Complete Left   No orders of the defined types were placed in this encounter.   Imaging: No results found.  PMFS History: There are no active problems to display for this patient.  Past Medical History:  Diagnosis Date   Kidney stone     No family history on file.  Past Surgical History:  Procedure Laterality Date   CHOLECYSTECTOMY N/A 05/30/2021   Procedure: LAPAROSCOPIC CHOLECYSTECTOMY WITH ICG GREEN DYE;  Surgeon: Tanda Locus, MD;  Location: [facility] OR;  Service: General;  Laterality: N/A;   INTRAOPERATIVE CHOLANGIOGRAM N/A 05/30/2021   Procedure: INTRAOPERATIVE CHOLANGIOGRAM;  Surgeon: Tanda Locus, MD;  Location: [facility] OR;  Service: General;  Laterality: N/A;   Social History   Occupational History   Not on file  Tobacco Use   Smoking status: Former    Current packs/day: 0.00    Types: Cigarettes    Quit date: 04/17/2018    Years since quitting: 5.6   Smokeless tobacco: Never  Substance and Sexual Activity   Alcohol use: No   Drug use: Not on file   Sexual activity: Not on file

## 2023-12-01 ENCOUNTER — Encounter: Payer: Self-pay | Admitting: Surgical

## 2023-12-03 ENCOUNTER — Encounter: Admitting: Surgical

## 2024-02-18 ENCOUNTER — Encounter: Payer: Self-pay | Admitting: Radiology
